# Patient Record
Sex: Male | Born: 1980 | Race: White | Hispanic: No | Smoking: Current every day smoker
Health system: Southern US, Community
[De-identification: ages and names within clinical notes are randomized; demographics above are authoritative.]

## PROBLEM LIST (undated history)

## (undated) ENCOUNTER — Emergency Department (HOSPITAL_COMMUNITY): Admission: EM | Payer: Self-pay | Source: Home / Self Care

## (undated) DIAGNOSIS — J45909 Unspecified asthma, uncomplicated: Secondary | ICD-10-CM

## (undated) DIAGNOSIS — R011 Cardiac murmur, unspecified: Secondary | ICD-10-CM

## (undated) DIAGNOSIS — R51 Headache: Secondary | ICD-10-CM

## (undated) DIAGNOSIS — R519 Headache, unspecified: Secondary | ICD-10-CM

## (undated) DIAGNOSIS — E859 Amyloidosis, unspecified: Secondary | ICD-10-CM

## (undated) DIAGNOSIS — G8929 Other chronic pain: Secondary | ICD-10-CM

## (undated) HISTORY — PX: ADENOIDECTOMY: SUR15

## (undated) HISTORY — PX: TONSILLECTOMY: SUR1361

---

## 1999-12-20 ENCOUNTER — Encounter: Payer: Self-pay | Admitting: Emergency Medicine

## 1999-12-20 ENCOUNTER — Emergency Department (HOSPITAL_COMMUNITY): Admission: EM | Admit: 1999-12-20 | Discharge: 1999-12-20 | Payer: Self-pay | Admitting: Emergency Medicine

## 2006-08-27 ENCOUNTER — Emergency Department (HOSPITAL_COMMUNITY): Admission: EM | Admit: 2006-08-27 | Discharge: 2006-08-27 | Payer: Self-pay | Admitting: Emergency Medicine

## 2007-01-25 ENCOUNTER — Emergency Department (HOSPITAL_COMMUNITY): Admission: EM | Admit: 2007-01-25 | Discharge: 2007-01-26 | Payer: Self-pay | Admitting: Emergency Medicine

## 2008-09-02 ENCOUNTER — Emergency Department (HOSPITAL_COMMUNITY): Admission: EM | Admit: 2008-09-02 | Discharge: 2008-09-02 | Payer: Self-pay | Admitting: Emergency Medicine

## 2011-01-11 LAB — I-STAT 8, (EC8 V) (CONVERTED LAB)
Acid-Base Excess: 3 — ABNORMAL HIGH
BUN: 8
Bicarbonate: 29.6 — ABNORMAL HIGH
Chloride: 106
Hemoglobin: 18.4 — ABNORMAL HIGH
Operator id: 272551
pCO2, Ven: 48.6
pH, Ven: 7.393 — ABNORMAL HIGH

## 2011-01-11 LAB — ETHANOL: Alcohol, Ethyl (B): 204 — ABNORMAL HIGH

## 2011-01-11 LAB — URINALYSIS, ROUTINE W REFLEX MICROSCOPIC
Hgb urine dipstick: NEGATIVE
Ketones, ur: NEGATIVE
Urobilinogen, UA: 0.2

## 2011-01-11 LAB — RAPID URINE DRUG SCREEN, HOSP PERFORMED
Amphetamines: NOT DETECTED
Barbiturates: NOT DETECTED
Cocaine: NOT DETECTED
Opiates: NOT DETECTED
Tetrahydrocannabinol: NOT DETECTED

## 2011-01-11 LAB — POCT I-STAT CREATININE: Creatinine, Ser: 1.3

## 2012-07-12 ENCOUNTER — Emergency Department (HOSPITAL_COMMUNITY)
Admission: EM | Admit: 2012-07-12 | Discharge: 2012-07-13 | Disposition: A | Discharge status: Home / Self Care | Payer: Self-pay | Attending: Emergency Medicine | Admitting: Emergency Medicine

## 2012-07-12 ENCOUNTER — Encounter (HOSPITAL_COMMUNITY): Payer: Self-pay | Admitting: *Deleted

## 2012-07-12 DIAGNOSIS — F17200 Nicotine dependence, unspecified, uncomplicated: Secondary | ICD-10-CM | POA: Insufficient documentation

## 2012-07-12 DIAGNOSIS — R1084 Generalized abdominal pain: Secondary | ICD-10-CM | POA: Insufficient documentation

## 2012-07-12 DIAGNOSIS — R011 Cardiac murmur, unspecified: Secondary | ICD-10-CM | POA: Insufficient documentation

## 2012-07-12 DIAGNOSIS — R112 Nausea with vomiting, unspecified: Secondary | ICD-10-CM | POA: Insufficient documentation

## 2012-07-12 DIAGNOSIS — J45909 Unspecified asthma, uncomplicated: Secondary | ICD-10-CM | POA: Insufficient documentation

## 2012-07-12 DIAGNOSIS — R197 Diarrhea, unspecified: Secondary | ICD-10-CM | POA: Insufficient documentation

## 2012-07-12 DIAGNOSIS — Z87898 Personal history of other specified conditions: Secondary | ICD-10-CM

## 2012-07-12 HISTORY — DX: Cardiac murmur, unspecified: R01.1

## 2012-07-12 HISTORY — DX: Unspecified asthma, uncomplicated: J45.909

## 2012-07-12 NOTE — ED Notes (Addendum)
Pt refusing IV at this time. States "I don't need all of that. Honestly, I just need a work note for today since I didn't go to work this morning.I feel better right now."

## 2012-07-12 NOTE — ED Notes (Signed)
Pt has had N/V/D since yesterday morning.  Pt states vomited "15 times" in past 24 hours and diarrhea "about the same amount of times".  Last occurrence around 1930.  Pt reports having cold sweats.

## 2012-07-13 ENCOUNTER — Encounter (HOSPITAL_COMMUNITY): Payer: Self-pay

## 2012-07-13 MED ORDER — LOPERAMIDE HCL 2 MG PO CAPS: 2.0000 mg | ORAL_CAPSULE | Freq: Four times a day (QID) | ORAL | Status: DC | PRN

## 2012-07-13 MED ORDER — ONDANSETRON HCL 4 MG PO TABS: 4.0000 mg | ORAL_TABLET | Freq: Four times a day (QID) | ORAL | Status: DC
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
## 2012-07-28 ENCOUNTER — Encounter (HOSPITAL_COMMUNITY): Payer: Self-pay | Admitting: Emergency Medicine

## 2012-07-28 ENCOUNTER — Emergency Department (HOSPITAL_COMMUNITY)
Admission: EM | Admit: 2012-07-28 | Discharge: 2012-07-28 | Disposition: A | Discharge status: Home / Self Care | Payer: Self-pay | Attending: Emergency Medicine | Admitting: Emergency Medicine

## 2012-07-28 DIAGNOSIS — W261XXA Contact with sword or dagger, initial encounter: Secondary | ICD-10-CM | POA: Insufficient documentation

## 2012-07-28 DIAGNOSIS — S61411A Laceration without foreign body of right hand, initial encounter: Secondary | ICD-10-CM

## 2012-07-28 DIAGNOSIS — R011 Cardiac murmur, unspecified: Secondary | ICD-10-CM | POA: Insufficient documentation

## 2012-07-28 DIAGNOSIS — J45909 Unspecified asthma, uncomplicated: Secondary | ICD-10-CM | POA: Insufficient documentation

## 2012-07-28 DIAGNOSIS — Y93G1 Activity, food preparation and clean up: Secondary | ICD-10-CM | POA: Insufficient documentation

## 2012-07-28 DIAGNOSIS — F17200 Nicotine dependence, unspecified, uncomplicated: Secondary | ICD-10-CM | POA: Insufficient documentation

## 2012-07-28 DIAGNOSIS — Y92009 Unspecified place in unspecified non-institutional (private) residence as the place of occurrence of the external cause: Secondary | ICD-10-CM | POA: Insufficient documentation

## 2012-07-28 DIAGNOSIS — W260XXA Contact with knife, initial encounter: Secondary | ICD-10-CM | POA: Insufficient documentation

## 2012-07-28 DIAGNOSIS — S61409A Unspecified open wound of unspecified hand, initial encounter: Secondary | ICD-10-CM | POA: Insufficient documentation

## 2012-07-28 NOTE — ED Provider Notes (Signed)
Medical screening examination/treatment/procedure(s) were performed by non-physician practitioner and as supervising physician I was immediately available for consultation/collaboration.  Johndavid Geralds R. Cami Delawder, MD 07/28/12 1521 

## 2012-07-28 NOTE — ED Notes (Signed)
Laceration to rt. Medial part of hand.

## 2012-07-28 NOTE — ED Notes (Signed)
Pt. Stated, I was doing some cooking and I cut myself accidentally.

## 2012-07-28 NOTE — ED Provider Notes (Signed)
History     CSN: [id number]  Arrival date & time 07/28/12  1206   First MD Initiated Contact with Patient 07/28/12 1254      Chief Complaint  Patient presents with  . Laceration    (Consider location/radiation/quality/duration/timing/severity/associated sxs/prior treatment) HPI Patient presents emergency department with a laceration to his right hand.  Patient, states he was cooking some food when he sliced his hand, with a knife.  The laceration is on the  Anatomical medial aspect of the hand.  Patient denies numbness or weakness.  He states he applied direct pressure to stop the bleeding Past Medical History  Diagnosis Date  . Heart murmur   . Asthma     History reviewed. No pertinent past surgical history.  No family history on file.  History  Substance Use Topics  . Smoking status: Current Every Day Smoker -- 0.50 packs/day    Types: Cigarettes  . Smokeless tobacco: Never Used  . Alcohol Use: No      Review of Systems All other systems negative except as documented in the HPI. All pertinent positives and negatives as reviewed in the HPI. Allergies  Review of patient's allergies indicates no known allergies.  Home Medications  No current outpatient prescriptions on file.  BP 133/67  Pulse 90  Temp(Src) 98.9 F (37.2 C) (Oral)  Resp 18  SpO2 98%  Physical Exam  Vitals reviewed. Constitutional: He is oriented to person, place, and time. He appears well-developed and well-nourished. No distress.  Pulmonary/Chest: Effort normal.  Musculoskeletal:       Right hand: Normal sensation noted. Normal strength noted.       Hands: Neurological: He is alert and oriented to person, place, and time.  Skin: Skin is warm and dry.    ED Course  Procedures (including critical care time)  LACERATION REPAIR Performed by: Carlyle Dolly Authorized by: Carlyle Dolly Consent: Verbal consent obtained. Risks and benefits: risks, benefits and alternatives  were discussed Consent given by: patient Patient identity confirmed: provided demographic data Prepped and Draped in normal sterile fashion Wound explored  Laceration Location: Medial aspect of right hand  Laceration Length 3.2cm  No Foreign Bodies seen or palpated  Anesthesia: local infiltration  Local anesthetic: lidocaine 2 % with epinephrine  Anesthetic total: 6 ml  Irrigation method: syringe Amount of cleaning: standard  Skin closure: 5-0 Prolene   Number of sutures: 5   Technique: Simple interrupted   Patient tolerance: Patient tolerated the procedure well with no immediate complications.   Patient is advised to have sutures out in 10 days.  Advised to keep the area clean and dry.  Told to return here for any signs of infection.  Patient has no signs of foreign body inside the wound.  MDM          Carlyle Dolly, PA-C 07/28/12 1408

## 2012-08-16 ENCOUNTER — Encounter (HOSPITAL_COMMUNITY): Payer: Self-pay | Admitting: Emergency Medicine

## 2012-08-16 ENCOUNTER — Emergency Department (HOSPITAL_COMMUNITY)
Admission: EM | Admit: 2012-08-16 | Discharge: 2012-08-16 | Disposition: A | Discharge status: Home / Self Care | Payer: Self-pay | Attending: Emergency Medicine | Admitting: Emergency Medicine

## 2012-08-16 DIAGNOSIS — S61409A Unspecified open wound of unspecified hand, initial encounter: Secondary | ICD-10-CM | POA: Insufficient documentation

## 2012-08-16 DIAGNOSIS — F17200 Nicotine dependence, unspecified, uncomplicated: Secondary | ICD-10-CM | POA: Insufficient documentation

## 2012-08-16 DIAGNOSIS — W268XXA Contact with other sharp object(s), not elsewhere classified, initial encounter: Secondary | ICD-10-CM | POA: Insufficient documentation

## 2012-08-16 DIAGNOSIS — Y9389 Activity, other specified: Secondary | ICD-10-CM | POA: Insufficient documentation

## 2012-08-16 DIAGNOSIS — Y929 Unspecified place or not applicable: Secondary | ICD-10-CM | POA: Insufficient documentation

## 2012-08-16 DIAGNOSIS — IMO0002 Reserved for concepts with insufficient information to code with codable children: Secondary | ICD-10-CM

## 2012-08-16 DIAGNOSIS — R011 Cardiac murmur, unspecified: Secondary | ICD-10-CM | POA: Insufficient documentation

## 2012-08-16 DIAGNOSIS — J45909 Unspecified asthma, uncomplicated: Secondary | ICD-10-CM | POA: Insufficient documentation

## 2012-08-16 DIAGNOSIS — Z23 Encounter for immunization: Secondary | ICD-10-CM | POA: Insufficient documentation
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
## 2012-08-16 NOTE — ED Notes (Signed)
Pt tolerated laceration repair without issue

## 2012-08-16 NOTE — ED Notes (Signed)
PT comfortable with d/c and f/u instructions.  
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
## 2012-08-16 NOTE — ED Provider Notes (Signed)
Medical screening examination/treatment/procedure(s) were performed by non-physician practitioner and as supervising physician I was immediately available for consultation/collaboration.  Philopater Mucha M Sole Lengacher, MD 08/16/12 0302 

## 2013-06-11 ENCOUNTER — Encounter (HOSPITAL_COMMUNITY): Payer: Self-pay | Admitting: Emergency Medicine

## 2013-06-11 ENCOUNTER — Emergency Department (HOSPITAL_COMMUNITY)
Admission: EM | Admit: 2013-06-11 | Discharge: 2013-06-12 | Disposition: A | Discharge status: Home / Self Care | Payer: Self-pay | Attending: Emergency Medicine | Admitting: Emergency Medicine

## 2013-06-11 ENCOUNTER — Emergency Department (HOSPITAL_COMMUNITY): Payer: Self-pay

## 2013-06-11 DIAGNOSIS — R011 Cardiac murmur, unspecified: Secondary | ICD-10-CM | POA: Insufficient documentation

## 2013-06-11 DIAGNOSIS — J45909 Unspecified asthma, uncomplicated: Secondary | ICD-10-CM | POA: Insufficient documentation

## 2013-06-11 DIAGNOSIS — F17200 Nicotine dependence, unspecified, uncomplicated: Secondary | ICD-10-CM | POA: Insufficient documentation

## 2013-06-11 DIAGNOSIS — W3400XA Accidental discharge from unspecified firearms or gun, initial encounter: Secondary | ICD-10-CM | POA: Insufficient documentation

## 2013-06-11 DIAGNOSIS — Y929 Unspecified place or not applicable: Secondary | ICD-10-CM | POA: Insufficient documentation

## 2013-06-11 DIAGNOSIS — Y9389 Activity, other specified: Secondary | ICD-10-CM | POA: Insufficient documentation

## 2013-06-11 DIAGNOSIS — S31809A Unspecified open wound of unspecified buttock, initial encounter: Secondary | ICD-10-CM | POA: Insufficient documentation

## 2013-06-11 NOTE — ED Notes (Signed)
Pt denies fever, N/V.

## 2013-06-11 NOTE — ED Notes (Signed)
Nurse First Rounds : Nurse explained delay . Wait time and process to pt. Respirations unlabored / VSS / no distress.
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
## 2013-06-12 MED ORDER — SULFAMETHOXAZOLE-TRIMETHOPRIM 800-160 MG PO TABS: 1.0000 | ORAL_TABLET | Freq: Two times a day (BID) | ORAL | Status: DC

## 2013-06-12 NOTE — ED Provider Notes (Signed)
Medical screening examination/treatment/procedure(s) were performed by non-physician practitioner and as supervising physician I was immediately available for consultation/collaboration.   EKG Interpretation None        Sailor Haughn M Airika Alkhatib, MD 06/12/13 0503 

## 2013-06-12 NOTE — ED Notes (Signed)
Pt is ambulatory. Pt is being discharged with family member.
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
## 2013-07-02 ENCOUNTER — Telehealth (HOSPITAL_COMMUNITY): Payer: Self-pay

## 2013-07-02 NOTE — Telephone Encounter (Signed)
We have never seen the patient and therefore I cannot give him any medical advice.  I called him and left a message to refer back to ED when he was seen on the 11th.  Carnelia Oscar, ANP-BC

## 2013-09-23 ENCOUNTER — Encounter (HOSPITAL_COMMUNITY): Payer: Self-pay | Admitting: Emergency Medicine

## 2013-09-23 ENCOUNTER — Emergency Department (HOSPITAL_COMMUNITY)
Admission: EM | Admit: 2013-09-23 | Discharge: 2013-09-23 | Disposition: A | Discharge status: Home / Self Care | Payer: Self-pay | Attending: Emergency Medicine | Admitting: Emergency Medicine

## 2013-09-23 ENCOUNTER — Emergency Department (HOSPITAL_COMMUNITY): Payer: Self-pay

## 2013-09-23 DIAGNOSIS — F17200 Nicotine dependence, unspecified, uncomplicated: Secondary | ICD-10-CM | POA: Insufficient documentation

## 2013-09-23 DIAGNOSIS — R011 Cardiac murmur, unspecified: Secondary | ICD-10-CM | POA: Insufficient documentation

## 2013-09-23 DIAGNOSIS — M79601 Pain in right arm: Secondary | ICD-10-CM

## 2013-09-23 DIAGNOSIS — J45909 Unspecified asthma, uncomplicated: Secondary | ICD-10-CM | POA: Insufficient documentation

## 2013-09-23 DIAGNOSIS — M79609 Pain in unspecified limb: Secondary | ICD-10-CM | POA: Insufficient documentation

## 2013-09-23 LAB — CK: CK TOTAL: 5592 U/L — AB (ref 7–232)

## 2013-09-23 LAB — BASIC METABOLIC PANEL
BUN: 8 mg/dL (ref 6–23)
CHLORIDE: 104 meq/L (ref 96–112)
CO2: 24 meq/L (ref 19–32)
Calcium: 9.7 mg/dL (ref 8.4–10.5)
Creatinine, Ser: 1.15 mg/dL (ref 0.50–1.35)
GFR calc Af Amer: >90 mL/min (ref >90)
GFR calc non Af Amer: 82 mL/min — ABNORMAL LOW (ref >90)
GLUCOSE: 94 mg/dL (ref 70–99)
POTASSIUM: 4.5 meq/L (ref 3.7–5.3)
SODIUM: 140 meq/L (ref 137–147)

## 2013-09-23 LAB — URINE MICROSCOPIC-ADD ON

## 2013-09-23 LAB — URINALYSIS, ROUTINE W REFLEX MICROSCOPIC
Bilirubin Urine: NEGATIVE
GLUCOSE, UA: NEGATIVE mg/dL
Hgb urine dipstick: NEGATIVE
Ketones, ur: 15 mg/dL — AB
Leukocytes, UA: NEGATIVE
Nitrite: NEGATIVE
PH: 6 (ref 5.0–8.0)
PROTEIN: 30 mg/dL — AB
SPECIFIC GRAVITY, URINE: 1.023 (ref 1.005–1.030)
Urobilinogen, UA: 1 mg/dL (ref 0.0–1.0)

## 2013-09-23 LAB — I-STAT TROPONIN, ED: Troponin i, poc: 0 ng/mL (ref 0.00–0.08)

## 2013-09-23 LAB — CBC
HCT: 43.3 % (ref 39.0–52.0)
Hemoglobin: 14.7 g/dL (ref 13.0–17.0)
MCH: 29.6 pg (ref 26.0–34.0)
MCHC: 33.9 g/dL (ref 30.0–36.0)
MCV: 87.3 fL (ref 78.0–100.0)
PLATELETS: 153 10*3/uL (ref 150–400)
RBC: 4.96 MIL/uL (ref 4.22–5.81)
RDW: 13.4 % (ref 11.5–15.5)
WBC: 6.2 10*3/uL (ref 4.0–10.5)

## 2013-09-23 LAB — I-STAT CG4 LACTIC ACID, ED: LACTIC ACID, VENOUS: 0.63 mmol/L (ref 0.5–2.2)

## 2013-09-23 MED ORDER — MORPHINE SULFATE 4 MG/ML IJ SOLN
4.0000 mg | Freq: Once | INTRAMUSCULAR | Status: AC
  Administered 2013-09-23: 4 mg via INTRAVENOUS

## 2013-09-23 MED ORDER — SODIUM CHLORIDE 0.9 % IV BOLUS (SEPSIS)
1000.0000 mL | Freq: Once | INTRAVENOUS | Status: AC
  Administered 2013-09-23: 1000 mL via INTRAVENOUS

## 2013-09-23 MED ORDER — MORPHINE SULFATE 4 MG/ML IJ SOLN
4.0000 mg | Freq: Once | INTRAMUSCULAR | Status: DC
  Filled 2013-09-23: qty 1

## 2013-09-23 MED ORDER — HYDROCODONE-ACETAMINOPHEN 5-325 MG PO TABS: 1.0000 | ORAL_TABLET | ORAL | Status: DC | PRN

## 2013-09-23 NOTE — ED Notes (Signed)
Pt brought to room; pt undressed, in gown, on monitor, continuous pulse oximetry and blood pressure cuff; Toniann FailWendy, RN aware

## 2013-09-23 NOTE — ED Notes (Signed)
Pt states hes had tingling and pain in R arm for past few days. He states when he tried to pick up something the symptoms increase. He denies chest pain now, but states hes had some intermittent cp for "years." he is alert and breathing easily

## 2013-09-23 NOTE — ED Provider Notes (Signed)
Medical screening examination/treatment/procedure(s) were conducted as a shared visit with non-physician practitioner(s) and myself.  I personally evaluated the patient during the encounter.   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  He states his been working hard with this.  This is likely musculoskeletal arm pain.  He has mild elevation of his CKs with normal renal function.  Patient was hydrated in the emergency department.  He is urinating without difficulty.  Patient instructed to continue oral hydration.  Home with a short course of pain medicine.  No signs of infection.  Normal right radial pulse.  Overall well-appearing.  This is not a ncardiac presentation.   Lyanne CoKevin M Campos, MD 09/23/13 [phone]

## 2013-09-23 NOTE — Progress Notes (Signed)
Right upper extremity venous duplex:  No evidence of DVT or superficial thrombosis.    

## 2013-09-23 NOTE — ED Provider Notes (Signed)
CSN: [id number]     Arrival date & time 09/23/13  1222 History   First MD Initiated Contact with Patient 09/23/13 1234     Chief Complaint  Patient presents with  . Arm Pain     (Consider location/radiation/quality/duration/timing/severity/associated sxs/prior Treatment) HPI Comments: Todd Randolph is a 33 y.o. Male with a PMHx of asthma who presents to the ED today complaining of 4/10 sharp R upper arm pain and intermittent numbness, that began 3 days ago. Pain is located in right bicep and radiates down to right hand. Has not tried anything to relieve this pain. Aggravated by lifting heavy objects and moving his arm. States the pain comes and goes. Endorses slightly darker urine recently. Denies CP currently, but states he's had CP intermittently for years. Denies fevers/chills, HA, neck pain, vision changes, DOE, SOB, chest tightness or pressure, radiation to left arm, jaw or back, abd pain, N/V/D, myalgias in other extremities, weakness, paresthesias, diaphoresis, or hematuria/dysuria. Denies leg swelling or pain. Denies erythema or swelling to his R arm. No recent travel, no recent injury or trauma, denies strenuous activity aside from his job. Works at a factory lifting heavy objects, but denies lifting anything when this arm starting hurting. Smokes daily, denies alcohol or IV drug use. No hx of DVT.   Patient is a 33 y.o. male presenting with arm pain. The history is provided by the patient. No language interpreter was used.  Arm Pain This is a new problem. The current episode started in the past 7 days. The problem occurs intermittently. The problem has been waxing and waning. Associated symptoms include myalgias (right bicep) and numbness (intermittent, into right hand). Pertinent negatives include no abdominal pain, anorexia, arthralgias, chest pain (currently), chills, congestion, coughing, diaphoresis, fever, headaches, joint swelling, nausea, neck pain, rash, sore throat, urinary  symptoms, vertigo, visual change, vomiting or weakness. Exacerbated by: movement of right arm. He has tried nothing for the symptoms.    Past Medical History  Diagnosis Date  . Heart murmur   . Asthma    Past Surgical History  Procedure Laterality Date  . Adenoidectomy    . Tonsillectomy     History reviewed. No pertinent family history. History  Substance Use Topics  . Smoking status: Current Every Day Smoker -- 0.50 packs/day    Types: Cigarettes  . Smokeless tobacco: Never Used  . Alcohol Use: No    Review of Systems  Constitutional: Negative for fever, chills and diaphoresis.  HENT: Negative for congestion, rhinorrhea, sinus pressure and sore throat.   Respiratory: Negative for cough, choking, chest tightness, shortness of breath and wheezing.   Cardiovascular: Negative for chest pain (currently), palpitations and leg swelling.  Gastrointestinal: Negative for nausea, vomiting, abdominal pain, diarrhea, constipation, abdominal distention and anorexia.  Genitourinary: Negative for dysuria and hematuria.  Musculoskeletal: Positive for myalgias (right bicep). Negative for arthralgias, back pain, joint swelling, neck pain and neck stiffness.  Skin: Negative for color change and rash.  Neurological: Positive for numbness (intermittent, into right hand). Negative for dizziness, vertigo, syncope, weakness, light-headedness and headaches.  Psychiatric/Behavioral: Negative for confusion.  10 Systems reviewed and are negative for acute change except as noted in the HPI.     Allergies  Review of patient's allergies indicates no known allergies.  Home Medications   Prior to Admission medications   Not on File   BP 124/92  Pulse 67  Temp(Src) 98.8 F (37.1 C) (Oral)  Resp 24  Ht 6' (1.829 m)  Wt 171  lb (77.565 kg)  BMI 23.19 kg/m2  SpO2 100% Physical Exam  Nursing note and vitals reviewed. Constitutional: He is oriented to person, place, and time. Vital signs are  normal. He appears well-developed and well-nourished. No distress.  HENT:  Head: Normocephalic and atraumatic.  Mouth/Throat: Oropharynx is clear and moist and mucous membranes are normal.  Eyes: Conjunctivae and EOM are normal. Right eye exhibits no discharge. Left eye exhibits no discharge.  Neck: Normal range of motion. Neck supple. No spinous process tenderness and no muscular tenderness present. No rigidity. No edema present.  Cardiovascular: Normal rate, regular rhythm, normal heart sounds and intact distal pulses.   No murmur heard. Distal pulses intact in all extremities. No pedal edema noted  Pulmonary/Chest: Effort normal and breath sounds normal. He has no decreased breath sounds. He has no wheezes. He has no rales. He exhibits no tenderness and no crepitus.  Abdominal: Soft. Normal appearance and bowel sounds are normal. He exhibits no distension. There is no tenderness. There is no rigidity, no rebound, no guarding and no CVA tenderness.  Musculoskeletal: Normal range of motion.  Right shoulder non-TTP, no swelling or deformity, no crepitus. R arm ROM limited due to pain in biceps muscle. Right bicipital tendon non-tender, but muscle itself is TTP. No swelling or erythema noted in RUE. R elbow and wrist ROM intact, non-TTP. Muscle strength 5/5 in b/l UE. Sensation intact.   Neurological: He is alert and oriented to person, place, and time. He has normal strength. No sensory deficit.  Axillary, medial, radial, and ulnar nerve sensation and muscle strength intact in B/L UEs. Strength 5/5 bilaterally.   Skin: Skin is warm, dry and intact. No rash noted. No erythema.  No erythema or swelling noted in RUE.  Psychiatric: He has a normal mood and affect.    ED Course  Procedures (including critical care time) Labs Review Labs Reviewed  BASIC METABOLIC PANEL - Abnormal; Notable for the following:    GFR calc non Af Amer 82 (*)    All other components within normal limits  URINALYSIS,  ROUTINE W REFLEX MICROSCOPIC - Abnormal; Notable for the following:    Ketones, ur 15 (*)    Protein, ur 30 (*)    All other components within normal limits  CK - Abnormal; Notable for the following:    Total CK 5592 (*)    All other components within normal limits  URINE MICROSCOPIC-ADD ON - Abnormal; Notable for the following:    Squamous Epithelial / LPF FEW (*)    Bacteria, UA FEW (*)    All other components within normal limits  CBC  I-STAT TROPOININ, ED  I-STAT CG4 LACTIC ACID, ED    Imaging Review Dg Chest 2 View  09/23/2013   CLINICAL DATA:  33 year old male with pain and numbness radiating to the right arm with shortness of Breath. Initial encounter.  EXAM: CHEST  2 VIEW  COMPARISON:  01/25/2007.  FINDINGS: Lung volumes are stable and normal. Normal cardiac size and mediastinal contours. Visualized tracheal air column is within normal limits. Lung parenchyma is stable and clear. No pneumothorax or effusion. No acute osseous abnormality identified.  IMPRESSION: Negative, no acute cardiopulmonary abnormality.   Electronically Signed   By: Augusto Gamble M.D.   On: 09/23/2013 13:00   Upper extremity Venous Duplex U/S:  Smiley Houseman, RVT (Cardiovascular Sonographer)      Right upper extremity venous duplex: No evidence of DVT or superficial thrombosis.  EKG Interpretation   Date/Time:  Tuesday September 23 2013 12:29:42 EDT Ventricular Rate:  97 PR Interval:  142 QRS Duration: 84 QT Interval:  346 QTC Calculation: 439 R Axis:   85 Text Interpretation:  Normal sinus rhythm with sinus arrhythmia Normal ECG  No old tracing to compare Confirmed by [facility] HospitalINKER  MD, MARTHA [phone]([id number]) on  09/23/2013 3:28:17 PM      MDM   Final diagnoses:  None    Todd Randolph is a 33 y.o. male with a PMHx of asthma who presents with R arm/biceps pain with "tingling" x 3 days. Denies CP at this time, but has had intermittent CP "for years". Low clinical suspicion for ACS but will obtain labs and  cardiac rule out. DDx includes UE clot, although unlikely given lack of swelling and erythema; muscle strain, given his job and activities that worsen pain; rhabdomyolysis, or muscle spasm. Will give fluids and morphine, obtain CK and U/A, UE U/S, and reassess.  2:34 PM CXR negative. Troponin negative. Low clinical suspicion at this time for ACS. U/A demonstrates ketonuria and proteinuria with no signs of infection. CK 5592, therefore likely rhabdomyolysis or muscle injury; this would also account for the U/A results. BMP WNL, no electrolyte imbalances, no kidney dysfunction although Cr is nearing the upper limit of normal. Will give 2nd fluid bolus when pt returns from U/S and reassess.  3:15 PM Pt back from U/S, which is reported as negative for DVT per Alesia BandaSandra Biggs, RVT. Pain controlled better with Morphine, although still reporting some pain with movement. 2nd back of fluids hanging now. Will recheck CK after fluids, in addition to lactic acid, uric acid, and Phos.   4:23 PM Fluids infusing, almost complete. Will obtain f/up labs now and reassess degree of elevation in CK. Pain well controlled at the moment.  4:49 PM Labs pending, 3rd fluid bolus hanging now. Checked out to Azalia BilisKevin Campos MD who will follow up with labs and dispo pt. Please see his note for further documentation.  Donnita FallsMercedes Strupp East Hem[place]ub[place], [place] JerseyPA-C 09/23/13 1651

## 2013-09-26 ENCOUNTER — Encounter (HOSPITAL_COMMUNITY): Payer: Self-pay | Admitting: Emergency Medicine

## 2013-09-26 DIAGNOSIS — F17200 Nicotine dependence, unspecified, uncomplicated: Secondary | ICD-10-CM | POA: Insufficient documentation

## 2013-09-26 DIAGNOSIS — J45909 Unspecified asthma, uncomplicated: Secondary | ICD-10-CM | POA: Insufficient documentation

## 2013-09-26 DIAGNOSIS — K5900 Constipation, unspecified: Secondary | ICD-10-CM | POA: Insufficient documentation

## 2013-09-26 DIAGNOSIS — R011 Cardiac murmur, unspecified: Secondary | ICD-10-CM | POA: Insufficient documentation

## 2013-09-26 LAB — CBC WITH DIFFERENTIAL/PLATELET
BASOS ABS: 0 10*3/uL (ref 0.0–0.1)
Basophils Relative: 0 % (ref 0–1)
Eosinophils Absolute: 0.1 10*3/uL (ref 0.0–0.7)
Eosinophils Relative: 2 % (ref 0–5)
HCT: 40.8 % (ref 39.0–52.0)
Hemoglobin: 13.4 g/dL (ref 13.0–17.0)
LYMPHS PCT: 60 % — AB (ref 12–46)
Lymphs Abs: 2.6 10*3/uL (ref 0.7–4.0)
MCH: 29.5 pg (ref 26.0–34.0)
MCHC: 32.8 g/dL (ref 30.0–36.0)
MCV: 89.7 fL (ref 78.0–100.0)
Monocytes Absolute: 0.3 10*3/uL (ref 0.1–1.0)
Monocytes Relative: 6 % (ref 3–12)
NEUTROS ABS: 1.4 10*3/uL — AB (ref 1.7–7.7)
Neutrophils Relative %: 32 % — ABNORMAL LOW (ref 43–77)
PLATELETS: 151 10*3/uL (ref 150–400)
RBC: 4.55 MIL/uL (ref 4.22–5.81)
RDW: 13.5 % (ref 11.5–15.5)
WBC: 4.4 10*3/uL (ref 4.0–10.5)

## 2013-09-26 NOTE — ED Notes (Signed)
Presents with constipation for 4 days and vomiting. Endorses distended abdomen, abdomen is soft and non tender to palpation.

## 2013-09-26 NOTE — Discharge Planning (Signed)
[facility]ia[facility] was not able to see patient, [facility] orange card information will be mailed to the address listed in EPIC.

## 2013-09-27 ENCOUNTER — Emergency Department (HOSPITAL_COMMUNITY): Payer: Self-pay | Attending: Emergency Medicine

## 2013-09-27 ENCOUNTER — Emergency Department (HOSPITAL_COMMUNITY)
Admission: EM | Admit: 2013-09-27 | Discharge: 2013-09-27 | Disposition: A | Discharge status: Home / Self Care | Payer: Self-pay | Attending: Emergency Medicine | Admitting: Emergency Medicine

## 2013-09-27 DIAGNOSIS — K5900 Constipation, unspecified: Secondary | ICD-10-CM

## 2013-09-27 LAB — BASIC METABOLIC PANEL
BUN: 9 mg/dL (ref 6–23)
CHLORIDE: 103 meq/L (ref 96–112)
CO2: 28 meq/L (ref 19–32)
Calcium: 8.9 mg/dL (ref 8.4–10.5)
Creatinine, Ser: 1.08 mg/dL (ref 0.50–1.35)
GFR calc Af Amer: >90 mL/min (ref >90)
GFR calc non Af Amer: 88 mL/min — ABNORMAL LOW (ref >90)
Glucose, Bld: 88 mg/dL (ref 70–99)
POTASSIUM: 3.6 meq/L — AB (ref 3.7–5.3)
SODIUM: 142 meq/L (ref 137–147)

## 2013-09-27 NOTE — ED Provider Notes (Signed)
CSN: [id number]     Arrival date & time 09/26/13  2301 History   First MD Initiated Contact with Patient 09/27/13 0026     Chief Complaint  Patient presents with  . Constipation  . Emesis     (Consider location/radiation/quality/duration/timing/severity/associated sxs/prior Treatment) HPI Comments: 33 year old male with no significant medical or surgical history, smoker presents with no bowel movement since Tuesday. Patient has had one episode of vomiting daily nonbloody nonbilious. No known gallbladder problems no specific foods worsen. No current medications. No history of bowel obstruction or any significant medical history. Patient overall feels okay and is tolerating by mouth except for the one episode of vomiting a day. No known GERD history. Patient feels that started since being in ER for dehydration receiving morphine dose. No other narcotic or IV drug use history.  Patient is a 33 y.o. male presenting with constipation and vomiting. The history is provided by the patient.  Constipation Associated symptoms: abdominal pain (mild intermittent cramps no pain currently), nausea and vomiting   Associated symptoms: no back pain, no dysuria and no fever   Emesis Associated symptoms: abdominal pain (mild intermittent cramps no pain currently)   Associated symptoms: no chills and no headaches     Past Medical History  Diagnosis Date  . Heart murmur   . Asthma    Past Surgical History  Procedure Laterality Date  . Adenoidectomy    . Tonsillectomy     History reviewed. No pertinent family history. History  Substance Use Topics  . Smoking status: Current Every Day Smoker -- 0.50 packs/day    Types: Cigarettes  . Smokeless tobacco: Never Used  . Alcohol Use: No    Review of Systems  Constitutional: Negative for fever and chills.  HENT: Negative for congestion.   Eyes: Negative for visual disturbance.  Respiratory: Negative for shortness of breath.   Cardiovascular: Negative  for chest pain.  Gastrointestinal: Positive for nausea, vomiting, abdominal pain (mild intermittent cramps no pain currently) and constipation.  Genitourinary: Negative for dysuria and flank pain.  Musculoskeletal: Negative for back pain, neck pain and neck stiffness.  Skin: Negative for rash.  Neurological: Negative for light-headedness and headaches.      Allergies  Review of patient's allergies indicates no known allergies.  Home Medications   Prior to Admission medications   Medication Sig Start Date End Date Taking? Authorizing Provider  HYDROcodone-acetaminophen (NORCO/VICODIN) 5-325 MG per tablet Take 1 tablet by mouth every 4 (four) hours as needed for moderate pain. 09/23/13  Yes Lyanne CoKevin M Campos, MD  ibuprofen (ADVIL,MOTRIN) 200 MG tablet Take 800 mg by mouth every 6 (six) hours as needed for mild pain.   Yes Historical Provider, MD   BP 120/71  Pulse 53  Temp(Src) 98.1 F (36.7 C)  Resp 16  Ht 6' (1.829 m)  Wt 172 lb (78.019 kg)  BMI 23.32 kg/m2  SpO2 98% Physical Exam  Nursing note and vitals reviewed. Constitutional: He is oriented to person, place, and time. He appears well-developed and well-nourished.  HENT:  Head: Normocephalic and atraumatic.  Eyes: Conjunctivae are normal. Right eye exhibits no discharge. Left eye exhibits no discharge.  Neck: Normal range of motion. Neck supple. No tracheal deviation present.  Cardiovascular: Normal rate, regular rhythm and intact distal pulses.   Pulmonary/Chest: Effort normal and breath sounds normal.  Abdominal: Soft. He exhibits no distension. There is no tenderness. There is no guarding.  Musculoskeletal: He exhibits no edema.  Neurological: He is alert and oriented  to person, place, and time.  Skin: Skin is warm. No rash noted.  Psychiatric: He has a normal mood and affect.    ED Course  Procedures (including critical care time) Labs Review Labs Reviewed  CBC WITH DIFFERENTIAL - Abnormal; Notable for the  following:    Neutrophils Relative % 32 (*)    Neutro Abs 1.4 (*)    Lymphocytes Relative 60 (*)    All other components within normal limits  BASIC METABOLIC PANEL - Abnormal; Notable for the following:    Potassium 3.6 (*)    GFR calc non Af Amer 88 (*)    All other components within normal limits    Imaging Review Dg Abd 1 View  09/27/2013   CLINICAL DATA:  Constipation and emesis  EXAM: ABDOMEN - 1 VIEW  COMPARISON:  None.  FINDINGS: The bowel gas pattern is normal. No radio-opaque calculi or other significant radiographic abnormality are seen. Remote fracture of the proximal left femur, accounting for a small ossific fragment superior to the greater trochanter.  IMPRESSION: Negative.   Electronically Signed   By: Tiburcio PeaJonathan  Watts M.D.   On: 09/27/2013 00:32     EKG Interpretation None      MDM   Final diagnoses:  Constipation, unspecified constipation type   Well-appearing male with benign abdominal exam and constipation for 3 days. Discussed trying fruits and increase exercise and discussed reasons to return. Patient will try MiraLAX and prefers to try at home if change in diet exercise do not work.  If you were given medicines take as directed.  If you are on coumadin or contraceptives realize their levels and effectiveness is altered by many different medicines.  If you have any reaction (rash, tongues swelling, other) to the medicines stop taking and see a physician.   Please follow up as directed and return to the ER or see a physician for new or worsening symptoms.  Thank you. Filed Vitals:   09/27/13 0019 09/27/13 0030 09/27/13 0100 09/27/13 0145  BP: 120/71 117/66 117/68 115/63  Pulse: 53 59 55 57  Temp:      Resp:      Height:      Weight:      SpO2: 98% 97% 94% 95%     Enid SkeensJoshua M Zavitz, MD 09/27/13 [phone]

## 2013-09-27 NOTE — Discharge Instructions (Signed)
Try prunes and fruit with increased exercise, if no improvement try miralax from pharmacy.  If you were given medicines take as directed.  If you are on coumadin or contraceptives realize their levels and effectiveness is altered by many different medicines.  If you have any reaction (rash, tongues swelling, other) to the medicines stop taking and see a physician.   Please follow up as directed and return to the ER or see a physician for new or worsening symptoms.  Thank you. Filed Vitals:   09/26/13 2320 09/27/13 0019  BP: 116/71 120/71  Pulse: 55 53  Temp: 98.1 F (36.7 C)   Resp: 16   Height: 6' (1.829 m)   Weight: 172 lb (78.019 kg)   SpO2: 100% 98%    Constipation Constipation is when a person:  Poops (has a bowel movement) less than 3 times a week.  Has a hard time pooping.  Has poop that is dry, hard, or bigger than normal. HOME CARE   Eat foods with a lot of fiber in them. This includes fruits, vegetables, beans, and whole grains such as brown rice.  Avoid fatty foods and foods with a lot of sugar. This includes french fries, hamburgers, cookies, candy, and soda.  If you are not getting enough fiber from food, take products with added fiber in them (supplements).  Drink enough fluid to keep your pee (urine) clear or pale yellow.  Exercise on a regular basis, or as told by your doctor.  Go to the restroom when you feel like you need to poop. Do not hold it.  Only take medicine as told by your doctor. Do not take medicines that help you poop (laxatives) without talking to your doctor first. GET HELP RIGHT AWAY IF:   You have bright red blood in your poop (stool).  Your constipation lasts more than 4 days or gets worse.  You have belly (abdominal) or butt (rectal) pain.  You have thin poop (as thin as a pencil).  You lose weight, and it cannot be explained. MAKE SURE YOU:   Understand these instructions.  Will watch your condition.  Will get help right  away if you are not doing well or get worse. Document Released: 09/06/2007 Document Revised: 03/25/2013 Document Reviewed: 12/30/2012 [facility] Patient Information 2015 [employer] [place]. This information is not intended to replace advice given to you by your health care provider. Make sure you discuss any questions you have with your health care provider.

## 2013-09-27 NOTE — ED Notes (Signed)
Pt A&Ox4, ambulatory at d/c with steady gait, NAD/ Declined wheelchair. NAD.

## 2013-09-27 NOTE — ED Notes (Signed)
Pt states he was given morphine here on Tuesday, and since then he has been constipated. No distention or tenderness present upon palpation.

## 2013-09-27 NOTE — ED Notes (Signed)
Dr Zavitz at bedside  

## 2014-07-14 ENCOUNTER — Emergency Department (HOSPITAL_COMMUNITY)
Admission: EM | Admit: 2014-07-14 | Discharge: 2014-07-14 | Disposition: A | Discharge status: Home / Self Care | Payer: Self-pay | Attending: Emergency Medicine | Admitting: Emergency Medicine

## 2014-07-14 ENCOUNTER — Encounter (HOSPITAL_COMMUNITY): Payer: Self-pay | Admitting: Emergency Medicine

## 2014-07-14 ENCOUNTER — Emergency Department (HOSPITAL_COMMUNITY): Admission: EM | Admit: 2014-07-14 | Discharge: 2014-07-14 | Discharge status: Absent without leave | Payer: Self-pay

## 2014-07-14 DIAGNOSIS — R011 Cardiac murmur, unspecified: Secondary | ICD-10-CM | POA: Insufficient documentation

## 2014-07-14 DIAGNOSIS — Z720 Tobacco use: Secondary | ICD-10-CM | POA: Insufficient documentation

## 2014-07-14 DIAGNOSIS — J45909 Unspecified asthma, uncomplicated: Secondary | ICD-10-CM | POA: Insufficient documentation

## 2014-07-14 DIAGNOSIS — R112 Nausea with vomiting, unspecified: Secondary | ICD-10-CM | POA: Insufficient documentation

## 2014-07-14 LAB — COMPREHENSIVE METABOLIC PANEL
ALT: 17 U/L (ref 0–53)
AST: 29 U/L (ref 0–37)
Albumin: 4.2 g/dL (ref 3.5–5.2)
Alkaline Phosphatase: 36 U/L — ABNORMAL LOW (ref 39–117)
Anion gap: 9 (ref 5–15)
BUN: 9 mg/dL (ref 6–23)
CHLORIDE: 103 mmol/L (ref 96–112)
CO2: 27 mmol/L (ref 19–32)
Calcium: 10.1 mg/dL (ref 8.4–10.5)
Creatinine, Ser: 1.16 mg/dL (ref 0.50–1.35)
GFR calc Af Amer: >90 mL/min (ref >90)
GFR calc non Af Amer: 80 mL/min — ABNORMAL LOW (ref >90)
Glucose, Bld: 79 mg/dL (ref 70–99)
Potassium: 4.8 mmol/L (ref 3.5–5.1)
Sodium: 139 mmol/L (ref 135–145)
TOTAL PROTEIN: 7.2 g/dL (ref 6.0–8.3)
Total Bilirubin: 0.8 mg/dL (ref 0.3–1.2)

## 2014-07-14 LAB — CBC WITH DIFFERENTIAL/PLATELET
BASOS ABS: 0 10*3/uL (ref 0.0–0.1)
Basophils Relative: 0 % (ref 0–1)
EOS PCT: 1 % (ref 0–5)
Eosinophils Absolute: 0 10*3/uL (ref 0.0–0.7)
HCT: 45.5 % (ref 39.0–52.0)
HEMOGLOBIN: 15.1 g/dL (ref 13.0–17.0)
LYMPHS PCT: 50 % — AB (ref 12–46)
Lymphs Abs: 2.5 10*3/uL (ref 0.7–4.0)
MCH: 29.6 pg (ref 26.0–34.0)
MCHC: 33.2 g/dL (ref 30.0–36.0)
MCV: 89.2 fL (ref 78.0–100.0)
MONO ABS: 0.2 10*3/uL (ref 0.1–1.0)
MONOS PCT: 4 % (ref 3–12)
Neutro Abs: 2.2 10*3/uL (ref 1.7–7.7)
Neutrophils Relative %: 45 % (ref 43–77)
Platelets: 171 10*3/uL (ref 150–400)
RBC: 5.1 MIL/uL (ref 4.22–5.81)
RDW: 13.3 % (ref 11.5–15.5)
WBC: 4.9 10*3/uL (ref 4.0–10.5)

## 2014-07-14 LAB — LIPASE, BLOOD: LIPASE: 28 U/L (ref 11–59)

## 2014-07-14 MED ORDER — SUCRALFATE 1 G PO TABS: 1.0000 g | ORAL_TABLET | Freq: Three times a day (TID) | ORAL | Status: DC

## 2014-07-14 MED ORDER — ONDANSETRON 4 MG PO TBDP: 4.0000 mg | ORAL_TABLET | Freq: Three times a day (TID) | ORAL | Status: DC | PRN

## 2014-07-14 NOTE — ED Provider Notes (Signed)
CSN: [id number]     Arrival date & time 07/14/14  1012 History  This chart was scribed for non-physician practitioner, Sharilyn Sites, PA-C working with Azalia Bilis, MD by Greggory Stallion, ED scribe. This patient was seen in room TR01C/TR01C and the patient's care was started at 1:26 PM.   Chief Complaint  Patient presents with  . Emesis   The history is provided by the patient. No language interpreter was used.    HPI Comments: Milus Fritze is a 34 y.o. male who presents to the Emergency Department complaining of intermittent emesis over the last 2 months. Episodes are sporadic every week. Pt came in today because it has become more frequent within the last week. Emesis is random, not just after eating or drinking. He reports abdominal pain immediately before and after an episode and denies it currently. His diet has mainly been fatty/greasy foods and soda. Pt denies fever, nausea, diarrhea. He denies history of digestion problems and has never seen a GI doctor.  No prior abdominal surgeries.  Past Medical History  Diagnosis Date  . Heart murmur   . Asthma    Past Surgical History  Procedure Laterality Date  . Adenoidectomy    . Tonsillectomy     History reviewed. No pertinent family history. History  Substance Use Topics  . Smoking status: Current Every Day Smoker -- 0.50 packs/day    Types: Cigarettes  . Smokeless tobacco: Never Used  . Alcohol Use: No    Review of Systems  Constitutional: Negative for fever.  Gastrointestinal: Positive for vomiting. Negative for nausea and abdominal pain.  All other systems reviewed and are negative.  Allergies  Review of patient's allergies indicates no known allergies.  Home Medications   Prior to Admission medications   Medication Sig Start Date End Date Taking? Authorizing Provider  HYDROcodone-acetaminophen (NORCO/VICODIN) 5-325 MG per tablet Take 1 tablet by mouth every 4 (four) hours as needed for moderate pain. 09/23/13   Azalia Bilis, MD  ibuprofen (ADVIL,MOTRIN) 200 MG tablet Take 800 mg by mouth every 6 (six) hours as needed for mild pain.    Historical Provider, MD   BP 130/82 mmHg  Pulse 99  Temp(Src) 98 F (36.7 C) (Oral)  Resp 18  SpO2 100%   Physical Exam  Constitutional: He is oriented to person, place, and time. He appears well-developed and well-nourished. No distress.  HENT:  Head: Normocephalic and atraumatic.  Mouth/Throat: Oropharynx is clear and moist.  Eyes: Conjunctivae and EOM are normal. Pupils are equal, round, and reactive to light.  Neck: Normal range of motion. Neck supple.  Cardiovascular: Normal rate, regular rhythm and normal heart sounds.   Pulmonary/Chest: Effort normal and breath sounds normal. No respiratory distress. He has no wheezes.  Abdominal: Soft. Bowel sounds are normal. There is no tenderness. There is no guarding.  Musculoskeletal: Normal range of motion. He exhibits no edema.  Neurological: He is alert and oriented to person, place, and time.  Skin: Skin is warm and dry. He is not diaphoretic.  Psychiatric: He has a normal mood and affect.  Nursing note and vitals reviewed.   ED Course  Procedures (including critical care time)  DIAGNOSTIC STUDIES: Oxygen Saturation is 100% on RA, normal by my interpretation.    COORDINATION OF CARE: 1:31 PM-Discussed treatment plan which includes diet change and GI medications with pt at bedside and pt agreed to plan. Will give pt GI referral and advised him to follow up if symptoms do not start  resolving.   Labs Review Labs Reviewed  COMPREHENSIVE METABOLIC PANEL - Abnormal; Notable for the following:    Alkaline Phosphatase 36 (*)    GFR calc non Af Amer 80 (*)    All other components within normal limits  CBC WITH DIFFERENTIAL/PLATELET - Abnormal; Notable for the following:    Lymphocytes Relative 50 (*)    All other components within normal limits  LIPASE, BLOOD    Imaging Review No results found.   EKG  Interpretation None      MDM   Final diagnoses:  Nausea and vomiting, vomiting of unspecified type   34 year old male with remittent vomiting for the past 2 months. No definitive food associations, but he does have diet high in soda, fried food, and complex substances. On exam patient is afebrile and nontoxic in appearance. His abdominal exam is benign. He does not appear dehydrated. Lab work was obtained which is reassuring. Suspect that his symptoms are related to his diet.  Suggested dietary modifications as well as carafate and zofran for symptomatic control.  FU with GI if no improvement with conservative measures.  Discussed plan with patient, he/she acknowledged understanding and agreed with plan of care.  Return precautions given for new or worsening symptoms.  I personally performed the services described in this documentation, which was scribed in my presence. The recorded information has been reviewed and is accurate.  Garlon HatchetLisa M Adeeb Konecny, PA-C 07/14/14 1354  Azalia BilisKevin Campos, MD 07/14/14 [phone]

## 2014-07-14 NOTE — ED Notes (Addendum)
Pt reports on and off again vomiting sporadically for 2 mos. Today came in bc he saw blood streak in it. Denies belly pain. Reports feels better after he vomits. Denies alcohol use. States he coughs a lot right before.

## 2014-07-14 NOTE — Discharge Instructions (Signed)
Take the prescribed medication as directed top help with nausea/vomiting. Try to watch your diet over the next week or so to see what foods cause symptoms.  Try to swap out some of the fried/fatty foods for healthier choices. Follow-up with GI physician, Dr. Loreta AveMann, if symptoms not improving. Return to the ED for new or worsening symptoms.

## 2014-07-17 ENCOUNTER — Encounter (HOSPITAL_COMMUNITY): Payer: Self-pay | Admitting: Emergency Medicine

## 2014-07-17 ENCOUNTER — Emergency Department (HOSPITAL_COMMUNITY)
Admission: EM | Admit: 2014-07-17 | Discharge: 2014-07-17 | Disposition: A | Discharge status: Home / Self Care | Payer: Self-pay | Attending: Emergency Medicine | Admitting: Emergency Medicine

## 2014-07-17 DIAGNOSIS — J45909 Unspecified asthma, uncomplicated: Secondary | ICD-10-CM | POA: Insufficient documentation

## 2014-07-17 DIAGNOSIS — R011 Cardiac murmur, unspecified: Secondary | ICD-10-CM | POA: Insufficient documentation

## 2014-07-17 DIAGNOSIS — R1110 Vomiting, unspecified: Secondary | ICD-10-CM | POA: Insufficient documentation

## 2014-07-17 DIAGNOSIS — R1111 Vomiting without nausea: Secondary | ICD-10-CM

## 2014-07-17 DIAGNOSIS — Z79899 Other long term (current) drug therapy: Secondary | ICD-10-CM | POA: Insufficient documentation

## 2014-07-17 DIAGNOSIS — Z720 Tobacco use: Secondary | ICD-10-CM | POA: Insufficient documentation

## 2014-07-17 LAB — CBC WITH DIFFERENTIAL/PLATELET
BASOS ABS: 0 10*3/uL (ref 0.0–0.1)
Basophils Relative: 1 % (ref 0–1)
EOS PCT: 0 % (ref 0–5)
Eosinophils Absolute: 0 10*3/uL (ref 0.0–0.7)
HCT: 44.3 % (ref 39.0–52.0)
Hemoglobin: 14.5 g/dL (ref 13.0–17.0)
Lymphocytes Relative: 38 % (ref 12–46)
Lymphs Abs: 2.1 10*3/uL (ref 0.7–4.0)
MCH: 29.1 pg (ref 26.0–34.0)
MCHC: 32.7 g/dL (ref 30.0–36.0)
MCV: 88.8 fL (ref 78.0–100.0)
MONO ABS: 0.4 10*3/uL (ref 0.1–1.0)
Monocytes Relative: 8 % (ref 3–12)
Neutro Abs: 2.9 10*3/uL (ref 1.7–7.7)
Neutrophils Relative %: 53 % (ref 43–77)
Platelets: 178 10*3/uL (ref 150–400)
RBC: 4.99 MIL/uL (ref 4.22–5.81)
RDW: 13.2 % (ref 11.5–15.5)
WBC: 5.5 10*3/uL (ref 4.0–10.5)

## 2014-07-17 LAB — COMPREHENSIVE METABOLIC PANEL
ALK PHOS: 36 U/L — AB (ref 39–117)
ALT: 17 U/L (ref 0–53)
AST: 28 U/L (ref 0–37)
Albumin: 3.9 g/dL (ref 3.5–5.2)
Anion gap: 13 (ref 5–15)
BILIRUBIN TOTAL: 0.9 mg/dL (ref 0.3–1.2)
BUN: 11 mg/dL (ref 6–23)
CHLORIDE: 104 mmol/L (ref 96–112)
CO2: 21 mmol/L (ref 19–32)
Calcium: 9.4 mg/dL (ref 8.4–10.5)
Creatinine, Ser: 1.18 mg/dL (ref 0.50–1.35)
GFR calc Af Amer: >90 mL/min (ref >90)
GFR calc non Af Amer: 79 mL/min — ABNORMAL LOW (ref >90)
GLUCOSE: 84 mg/dL (ref 70–99)
Potassium: 4.2 mmol/L (ref 3.5–5.1)
SODIUM: 138 mmol/L (ref 135–145)
Total Protein: 6.9 g/dL (ref 6.0–8.3)

## 2014-07-17 LAB — LIPASE, BLOOD: LIPASE: 26 U/L (ref 11–59)

## 2014-07-17 MED ORDER — ONDANSETRON HCL 4 MG/2ML IJ SOLN
4.0000 mg | Freq: Once | INTRAMUSCULAR | Status: AC
  Administered 2014-07-17: 4 mg via INTRAVENOUS
  Filled 2014-07-17: qty 2

## 2014-07-17 MED ORDER — FAMOTIDINE IN NACL 20-0.9 MG/50ML-% IV SOLN
20.0000 mg | Freq: Once | INTRAVENOUS | Status: AC
  Administered 2014-07-17: 20 mg via INTRAVENOUS
  Filled 2014-07-17: qty 50

## 2014-07-17 MED ORDER — SUCRALFATE 1 G PO TABS
1.0000 g | ORAL_TABLET | Freq: Once | ORAL | Status: AC
  Administered 2014-07-17: 1 g via ORAL
  Filled 2014-07-17: qty 1

## 2014-07-17 MED ORDER — SODIUM CHLORIDE 0.9 % IV BOLUS (SEPSIS)
1000.0000 mL | Freq: Once | INTRAVENOUS | Status: AC
  Administered 2014-07-17: 1000 mL via INTRAVENOUS

## 2014-07-17 MED ORDER — DICYCLOMINE HCL 10 MG PO CAPS
10.0000 mg | ORAL_CAPSULE | Freq: Once | ORAL | Status: AC
  Administered 2014-07-17: 10 mg via ORAL
  Filled 2014-07-17: qty 1

## 2014-07-17 NOTE — ED Notes (Signed)
Provided ginger ale for the patient to drink at PO challenge.

## 2014-07-17 NOTE — ED Notes (Signed)
Pt aware of need of urine sample. Urinal given. States his pain is better.

## 2014-07-17 NOTE — ED Notes (Signed)
Patient reports he has been nauseous, unable to hold food down. 3 emesis episodes within the last 24 hours. Reports recently being seen for same, but unable to schedule follow up due to wait time of 1 month.

## 2014-07-17 NOTE — ED Provider Notes (Signed)
CSN: [id number]     Arrival date & time 07/17/14  0547 History   None    Chief Complaint  Patient presents with  . Nausea     (Consider location/radiation/quality/duration/timing/severity/associated sxs/prior Treatment) HPI   Todd Randolph is a 34 y.o. male complaining of 3 episodes of emesis over the last 24 hours associated with post emesis epigastric discomfort he denies bilious vomiting but states sometimes there is slight blood streaking. Patient was seen 48 hours ago and given a prescription for Zofran and Carafate which she has not filled. He denies fever, chills, states his stool is slightly more loose than normal, denies melena hematochezia. Patient has had episodes of cyclic vomiting multiple times, has appointment with a gastroenterologist in 1 month. No local primary care, he recently moved.    Past Medical History  Diagnosis Date  . Heart murmur   . Asthma    Past Surgical History  Procedure Laterality Date  . Adenoidectomy    . Tonsillectomy     History reviewed. No pertinent family history. History  Substance Use Topics  . Smoking status: Current Every Day Smoker -- 0.00 packs/day  . Smokeless tobacco: Never Used  . Alcohol Use: No    Review of Systems  10 systems reviewed and found to be negative, except as noted in the HPI.   Allergies  Review of patient's allergies indicates no known allergies.  Home Medications   Prior to Admission medications   Medication Sig Start Date End Date Taking? Authorizing Provider  ibuprofen (ADVIL,MOTRIN) 200 MG tablet Take 800 mg by mouth every 6 (six) hours as needed for mild pain.   Yes Historical Provider, MD  HYDROcodone-acetaminophen (NORCO/VICODIN) 5-325 MG per tablet Take 1 tablet by mouth every 4 (four) hours as needed for moderate pain. Patient not taking: Reported on 07/17/2014 09/23/13   Todd BilisKevin Campos, MD  ondansetron (ZOFRAN ODT) 4 MG disintegrating tablet Take 1 tablet (4 mg total) by mouth every 8 (eight)  hours as needed for nausea. Patient not taking: Reported on 07/17/2014 07/14/14   Todd HatchetLisa M Sanders, PA-C  sucralfate (CARAFATE) 1 G tablet Take 1 tablet (1 g total) by mouth 4 (four) times daily -  with meals and at bedtime. Patient not taking: Reported on 07/17/2014 07/14/14   Todd HatchetLisa M Sanders, PA-C   BP 107/64 mmHg  Pulse 54  Temp(Src) 98.1 F (36.7 C) (Oral)  Resp 17  Ht 5\' 11"  (1.803 m)  Wt 180 lb (81.647 kg)  BMI 25.12 kg/m2  SpO2 100% Physical Exam  Constitutional: He is oriented to person, place, and time. He appears well-developed and well-nourished. No distress.  Patient is comfortable, smiling, watching TV  HENT:  Head: Normocephalic.  Mouth/Throat: Oropharynx is clear and moist.  Eyes: Conjunctivae and EOM are normal.  Cardiovascular: Normal rate, regular rhythm and intact distal pulses.   Pulmonary/Chest: Effort normal and breath sounds normal. No stridor. No respiratory distress. He has no wheezes. He has no rales. He exhibits no tenderness.  Abdominal: Soft. Bowel sounds are normal. He exhibits no distension and no mass. There is no tenderness. There is no rebound and no guarding.  Musculoskeletal: Normal range of motion.  Neurological: He is alert and oriented to person, place, and time.  Psychiatric: He has a normal mood and affect.  Nursing note and vitals reviewed.   ED Course  Procedures (including critical care time) Labs Review Labs Reviewed  COMPREHENSIVE METABOLIC PANEL - Abnormal; Notable for the following:    Alkaline Phosphatase  36 (*)    GFR calc non Af Amer 79 (*)    All other components within normal limits  LIPASE, BLOOD  CBC WITH DIFFERENTIAL/PLATELET    Imaging Review No results found.   EKG Interpretation None      MDM   Final diagnoses:  Non-intractable vomiting without nausea, vomiting of unspecified type    Filed Vitals:   07/17/14 0604 07/17/14 0630 07/17/14 0700 07/17/14 0730  BP: 128/77 126/72 114/63 107/64  Pulse: 61 87 90 54   Temp: 98.1 F (36.7 C)     TempSrc: Oral     Resp: Height:  (1.803 m)     Weight: 180 lb (81.647 kg)     SpO2: 100% 100% 100% 100%    Medications  sodium chloride 0.9 % bolus 1,000 mL (0 mLs Intravenous Stopped 07/17/14 0746)  ondansetron (ZOFRAN) injection 4 mg (4 mg Intravenous Given 07/17/14 0640)  dicyclomine (BENTYL) capsule 10 mg (10 mg Oral Given 07/17/14 0640)  famotidine (PEPCID) IVPB 20 mg (0 mg Intravenous Stopped 07/17/14 0731)  sucralfate (CARAFATE) tablet 1 g (1 g Oral Given 07/17/14 0640)    Todd Randolph is a pleasant 34 y.o. male presenting with 3 episodes of vomiting over the last 24 hours. Patient is comfortable appearing, abdominal exam with no tenderness. Patient appears well-hydrated. He has not filled his prescription for Zofran after his visit 2 days ago. Plan is blood work, by mouth challenge.   Blood work with no abnormalities, patient has by mouth challenge. Serial abdominal exams are benign. Encourage patient to fill his prescription for Zofran and follow up with GI and primary care  Evaluation does not show pathology that would require ongoing emergent intervention or inpatient treatment. Pt is hemodynamically stable and mentating appropriately. Discussed findings and plan with patient/guardian, who agrees with care plan. All questions answered. Return precautions discussed and outpatient follow up given.       Todd Emery, PA-C 07/17/14 0746  Todd Jester, Todd Randolph 07/18/14 [phone]

## 2014-07-17 NOTE — Discharge Instructions (Signed)
Please fill the prescription you have for nausea medication at home.  Push fluids: take small frequent sips of water or Gatorade, do not drink any soda, juice or caffeinated beverages.    Slowly resume solid diet as desired. Avoid food that are spicy, contain dairy and/or have high fat content.  Aviod NSAIDs (aspirin, motrin, ibuprofen, naproxen, Aleve et Karie Soda) for pain control because they will irritate your stomach.  Do not hesitate to return to the emergency room for any new, worsening or concerning symptoms.  Please obtain primary care using resource guide below. But the minute you were seen in the emergency room and that they will need to obtain records for further outpatient management.   Emergency Department Resource Guide 1) Find a Doctor and Pay Out of Pocket Although you won't have to find out who is covered by your insurance plan, it is a good idea to ask around and get recommendations. You will then need to call the office and see if the doctor you have chosen will accept you as a new patient and what types of options they offer for patients who are self-pay. Some doctors offer discounts or will set up payment plans for their patients who do not have insurance, but you will need to ask so you aren't surprised when you get to your appointment.  2) Contact Your Local Health Department Not all health departments have doctors that can see patients for sick visits, but many do, so it is worth a call to see if yours does. If you don't know where your local health department is, you can check in your phone book. The CDC also has a tool to help you locate your state's health department, and many state websites also have listings of all of their local health departments.  3) Find a Walk-in Clinic If your illness is not likely to be very severe or complicated, you may want to try a walk in clinic. These are popping up all over the country in pharmacies, drugstores, and shopping centers.  They're usually staffed by nurse practitioners or physician assistants that have been trained to treat common illnesses and complaints. They're usually fairly quick and inexpensive. However, if you have serious medical issues or chronic medical problems, these are probably not your best option.  No Primary Care Doctor: - Call Health Connect at  [phone] - they can help you locate a primary care doctor that  accepts your insurance, provides certain services, etc. - Physician Referral Service- [phone]  Chronic Pain Problems: Organization         Address  Phone   Notes  [facility]  [phone] Patients need to be referred by their primary care doctor.   Medication Assistance: Organization         Address  Phone   Notes  [facility] Medication [facility] [address] [place], [place] [address] [phone] --Must be a resident of [facility] -- Must have NO insurance coverage whatsoever (no Medicaid/ Medicare, etc.) -- The pt. MUST have a primary care doctor that directs their care regularly and follows them in the community   [facility]ist  [phone]   [employer]  [phone]    Agencies that provide inexpensive medical care: Organization         Address  Phone   Notes  [facility]  [phone]   [facility]    [phone]   [facility]  Outpatient Clinic [place], [place] [address] [phone]   [place] [address], [place] [phone]   Planned Parenthood    [phone]   [facility]    [phone]   [place] and [address], [place] Phone:  [phone], Fax:  [phone] Hours of Operation:  9 am - 6 pm, M-F.  Also accepts Medicaid/Medicare and self-pay.  [facility] for [place], [address], [place] Phone: [phone], Fax: [phone]. Hours of Operation:  8:30 am - 5:30 pm, M-F.  Also accepts Medicaid and self-pay.  [facility] [place] [address], [place] Phone: [phone]   [place], [place], [place] [phone], Ext. [phone] Mondays & Thursdays: 7-9 AM.  First 15 patients are seen on a first come, first serve basis.    [place] Providers:  Organization         Address  Phone   Notes  [facility] [address], [place] [phone] Also accepts self-pay patients.  [facility] [id number] [place], [place]  [phone]   [place], [address], [place] [phone]   [facility] [address] [place] [phone]   Lucianne Lei [address], [place]   [phone] Only accepts [place] Access [place] patients after they have their name applied to their card.   Self-Pay (no insurance) in [facility]:  Organization         Address  Phone   Notes  Sickle Cell Patients, [facility] Internal Medicine [place] [phone]   [facility] Urgent Care [place] [phone]   [facility] [place]  [place], [place], [place] [phone]   [facility]/Dr. Osei-Bonsu  [address] [place] or [address], [place] [phone] Phone number for both [place] and [place] locations is the same.  Urgent Medical and [facility] [address] [place] [phone]   [facility] [address], [place] or [address] [phone]   [facility] [address], [place] [phone], phone; [phone], fax Sees patients 1st and 3rd Saturday of every month.  Must not qualify for public or private insurance (i.e.  Medicaid, Medicare, [place] Health Choice, Veterans' Benefits)  Household income should be no more than 200% of the poverty level The clinic cannot treat you if you are pregnant or think you are pregnant  Sexually transmitted diseases are not treated at the clinic.    Dental Care: Organization         Address  Phone  Notes  [facility] Department of [facility] [place] [phone] Accepts children up to age 59 who are enrolled in [place] or [place]; pregnant women with a Medicaid card; and children who have applied for Medicaid or [place] Health Choice, but were declined, whose parents can pay a reduced fee at time of service.  [facility] Department of [facility]  [address], [place] [phone] Accepts children up to age 64 who are enrolled  in Medicaid or [place] Health Choice; pregnant women with a Medicaid card; and children who have applied for Medicaid or [place] Health Choice, but were declined, whose parents can pay a reduced fee at time of service.  [place] Adult Dental Access PROGRAM  [place] [phone] Patients are seen by appointment only. Walk-ins are not accepted. [place] will see patients 45 years of age and older. Monday - Tuesday (8am-5pm) Most Wednesdays (8:30-5pm) $30 per visit, cash only  [facility] Adult Dental Access PROGRAM  [address], [facility] [phone] Patients are seen by appointment only. Walk-ins are not accepted. [place] will see patients 34 years of age and older. One Wednesday Evening (Monthly: Volunteer Based).  $30 per visit, cash only  [place]  [phone] for adults; Children under age 104, call Graduate Pediatric Dentistry at [phone]. Children aged 45-14, please call [phone] to request a pediatric application.  Dental services are provided in all areas of dental care including fillings,  crowns and bridges, complete and partial dentures, implants, gum treatment, root canals, and extractions. Preventive care is also provided. Treatment is provided to both adults and children. Patients are selected via a lottery and there is often a waiting list.   [facility] [address] [place]  [phone] [email or web address]   [facility] [address] [place], [place] [phone], Ext. [phone] Second and Fourth Thursday of each month, opens at 6:30 AM; Clinic ends at 9 AM.  Patients are seen on a first-come first-served basis, and a limited number are seen during each clinic.   [facility]  [address] Hillard Danker [place], [place] [phone]   Eligibility Requirements You must have lived in [place], [place], or [place] counties for at least the last three months.   You cannot be eligible for state or federal sponsored [employer], including [employer], [place], or [employer].   You generally cannot be eligible for healthcare insurance through your employer.    How to apply: Eligibility screenings are held every Tuesday and Wednesday afternoon from 1:00 pm until 4:00 pm. You do not need an appointment for the interview!  [facility] [address] [place], [place]   [place]  [place] Department  [place]  [phone]    Behavioral Health Resources in the Community: Intensive Outpatient Programs Organization         Address  Phone  Notes  [place]. [address] [place], [place] [phone]   [facility] [address] [place], [place]   ADS: [facility] [address], [place], [place]   [place] [address]  [place], [place] or [phone]   Substance Abuse  Resources Organization         Address  Phone  Notes  Alcohol and Drug Services  [phone]   [place]  [phone]   [facility] [place]   Chinita Pester  [phone]   Residential & Outpatient Substance Abuse Program  [phone]   Psychological Services Organization         Address  Phone  Notes  [facility] [place]  [place]  [phone]   [place] [address], [place]  or [phone]    Mobile Crisis Teams Organization         Address  Phone  Notes  [facility], Mobile Crisis Care Unit  [phone]   [facility]  [address] [place], [place] [phone]   [facility] [address] [place] [phone]    Self-Help/Support Groups Organization         Address  Phone             Notes  [facility]ental [facility]oc. of [place] - variety of support groups  [phone] Call for more information  Narcotics Anonymous (NA), [facility] [address], [place]  2 meetings at this location   Statisticianesidential Treatment Programs Organization         Address  Phone  Notes  [facility] [address],    [place]  [phone]   [facility]  [address], [place] [phone], [place], [place] [phone]   [facility] [address], [place] [phone] Admissions: 8am-3pm M-F  [facility] [address]    [place], [place] [phone]   [facility] [address], [place], [place] [phone]   [facility] [address]  [place], [place] [phone]   [facility] - Intensive Outpatient [address], [place], [place] [phone]   [facility] ([facility] [address]  [place], [place] [phone] or [phone]   [facility] (RT[facility]) [address] [place], [place] [phone] Accepts Medicaid  [facility] [place] [address]  [place] [phone] Substance Abuse/Addiction Treatment   [facility] Behavioral Health Resources Organization         Address  Phone  Notes  [facility]  [phone]) [phone]   Angie FavaJulie Brannon, PhD [address] [place], [place]   [phone](336) [phone] or [phone]   [facility]   [address] [place], [place] [phone]) [phone]   [facility] [address], [place], [place] [phone] Insurance/Medicaid/sponsorship through [facility]  [facility] [address]                                    [place], [place] [phone] Therapy/tele-psych/case  [facility] [address].   [place], [place] [phone]    Dr. Lolly MustacheArfeen  [phone]   [facility] [place]  [employer] [facility] 1) [address],  2) [address], [place] 3)  [address] [place] [address], [place] [phone]) [phone]  [phone]   [facility] Child Abuse Hotline [phone](336) [phone] or [phone](336) [phone] (After Hours)

## 2014-09-06 ENCOUNTER — Encounter (HOSPITAL_COMMUNITY): Payer: Self-pay | Admitting: *Deleted

## 2014-09-06 ENCOUNTER — Emergency Department (HOSPITAL_COMMUNITY): Payer: Self-pay

## 2014-09-06 ENCOUNTER — Emergency Department (HOSPITAL_COMMUNITY)
Admission: EM | Admit: 2014-09-06 | Discharge: 2014-09-06 | Disposition: A | Discharge status: Home / Self Care | Payer: Self-pay | Attending: Emergency Medicine | Admitting: Emergency Medicine

## 2014-09-06 DIAGNOSIS — Z720 Tobacco use: Secondary | ICD-10-CM | POA: Insufficient documentation

## 2014-09-06 DIAGNOSIS — Z8639 Personal history of other endocrine, nutritional and metabolic disease: Secondary | ICD-10-CM | POA: Insufficient documentation

## 2014-09-06 DIAGNOSIS — R0789 Other chest pain: Secondary | ICD-10-CM | POA: Insufficient documentation

## 2014-09-06 DIAGNOSIS — J45909 Unspecified asthma, uncomplicated: Secondary | ICD-10-CM | POA: Insufficient documentation

## 2014-09-06 DIAGNOSIS — R079 Chest pain, unspecified: Secondary | ICD-10-CM

## 2014-09-06 DIAGNOSIS — Z79899 Other long term (current) drug therapy: Secondary | ICD-10-CM | POA: Insufficient documentation

## 2014-09-06 DIAGNOSIS — M549 Dorsalgia, unspecified: Secondary | ICD-10-CM | POA: Insufficient documentation

## 2014-09-06 DIAGNOSIS — R011 Cardiac murmur, unspecified: Secondary | ICD-10-CM | POA: Insufficient documentation

## 2014-09-06 DIAGNOSIS — R112 Nausea with vomiting, unspecified: Secondary | ICD-10-CM | POA: Insufficient documentation

## 2014-09-06 HISTORY — DX: Amyloidosis, unspecified: E85.9

## 2014-09-06 LAB — COMPREHENSIVE METABOLIC PANEL
ALT: 21 U/L (ref 17–63)
AST: 28 U/L (ref 15–41)
Albumin: 4.2 g/dL (ref 3.5–5.0)
Alkaline Phosphatase: 41 U/L (ref 38–126)
Anion gap: 7 (ref 5–15)
BILIRUBIN TOTAL: 0.8 mg/dL (ref 0.3–1.2)
BUN: 13 mg/dL (ref 6–20)
CO2: 27 mmol/L (ref 22–32)
Calcium: 9.4 mg/dL (ref 8.9–10.3)
Chloride: 105 mmol/L (ref 101–111)
Creatinine, Ser: 1.25 mg/dL — ABNORMAL HIGH (ref 0.61–1.24)
GFR calc Af Amer: >60 mL/min (ref >60)
GFR calc non Af Amer: >60 mL/min (ref >60)
Glucose, Bld: 107 mg/dL — ABNORMAL HIGH (ref 65–99)
POTASSIUM: 4 mmol/L (ref 3.5–5.1)
SODIUM: 139 mmol/L (ref 135–145)
Total Protein: 7.5 g/dL (ref 6.5–8.1)

## 2014-09-06 LAB — CBC
HCT: 43 % (ref 39.0–52.0)
Hemoglobin: 14.4 g/dL (ref 13.0–17.0)
MCH: 29.1 pg (ref 26.0–34.0)
MCHC: 33.5 g/dL (ref 30.0–36.0)
MCV: 86.9 fL (ref 78.0–100.0)
Platelets: 183 10*3/uL (ref 150–400)
RBC: 4.95 MIL/uL (ref 4.22–5.81)
RDW: 13.1 % (ref 11.5–15.5)
WBC: 4.8 10*3/uL (ref 4.0–10.5)

## 2014-09-06 LAB — LIPASE, BLOOD: Lipase: 23 U/L (ref 22–51)

## 2014-09-06 LAB — I-STAT TROPONIN, ED: Troponin i, poc: 0 ng/mL (ref 0.00–0.08)

## 2014-09-06 MED ORDER — ONDANSETRON 4 MG PO TBDP
8.0000 mg | ORAL_TABLET | Freq: Once | ORAL | Status: AC
  Administered 2014-09-06: 8 mg via ORAL
  Filled 2014-09-06: qty 2

## 2014-09-06 MED ORDER — ONDANSETRON 8 MG PO TBDP: ORAL_TABLET | ORAL | Status: DC

## 2014-09-06 MED ORDER — ONDANSETRON HCL 4 MG/2ML IJ SOLN
4.0000 mg | Freq: Once | INTRAMUSCULAR | Status: DC
  Filled 2014-09-06: qty 2

## 2014-09-06 MED ORDER — SODIUM CHLORIDE 0.9 % IV BOLUS (SEPSIS): 1000.0000 mL | INTRAVENOUS | Status: DC

## 2014-09-06 NOTE — ED Notes (Signed)
Patient transported to X-ray 

## 2014-09-06 NOTE — Discharge Instructions (Signed)
1. Medications: zofran as needed for vomiting, usual home medications 2. Treatment: rest, drink plenty of fluids, advance diet slowly 3. Follow Up: Please followup with your primary doctor in 2 days for discussion of your diagnoses and further evaluation after today's visit; if you do not have a primary care doctor use the resource guide provided to find one; Please return to the ER for persistent vomiting, high fevers or worsening symptoms   Nausea and Vomiting Nausea is a sick feeling that often comes before throwing up (vomiting). Vomiting is a reflex where stomach contents come out of your mouth. Vomiting can cause severe loss of body fluids (dehydration). Children and elderly adults can become dehydrated quickly, especially if they also have diarrhea. Nausea and vomiting are symptoms of a condition or disease. It is important to find the cause of your symptoms. CAUSES   Direct irritation of the stomach lining. This irritation can result from increased acid production (gastroesophageal reflux disease), infection, food poisoning, taking certain medicines (such as nonsteroidal anti-inflammatory drugs), alcohol use, or tobacco use.  Signals from the brain.These signals could be caused by a headache, heat exposure, an inner ear disturbance, increased pressure in the brain from injury, infection, a tumor, or a concussion, pain, emotional stimulus, or metabolic problems.  An obstruction in the gastrointestinal tract (bowel obstruction).  Illnesses such as diabetes, hepatitis, gallbladder problems, appendicitis, kidney problems, cancer, sepsis, atypical symptoms of a heart attack, or eating disorders.  Medical treatments such as chemotherapy and radiation.  Receiving medicine that makes you sleep (general anesthetic) during surgery. DIAGNOSIS Your caregiver may ask for tests to be done if the problems do not improve after a few days. Tests may also be done if symptoms are severe or if the reason  for the nausea and vomiting is not clear. Tests may include:  Urine tests.  Blood tests.  Stool tests.  Cultures (to look for evidence of infection).  X-rays or other imaging studies. Test results can help your caregiver make decisions about treatment or the need for additional tests. TREATMENT You need to stay well hydrated. Drink frequently but in small amounts.You may wish to drink water, sports drinks, clear broth, or eat frozen ice pops or gelatin dessert to help stay hydrated.When you eat, eating slowly may help prevent nausea.There are also some antinausea medicines that may help prevent nausea. HOME CARE INSTRUCTIONS   Take all medicine as directed by your caregiver.  If you do not have an appetite, do not force yourself to eat. However, you must continue to drink fluids.  If you have an appetite, eat a normal diet unless your caregiver tells you differently.  Eat a variety of complex carbohydrates (rice, wheat, potatoes, bread), lean meats, yogurt, fruits, and vegetables.  Avoid high-fat foods because they are more difficult to digest.  Drink enough water and fluids to keep your urine clear or pale yellow.  If you are dehydrated, ask your caregiver for specific rehydration instructions. Signs of dehydration may include:  Severe thirst.  Dry lips and mouth.  Dizziness.  Dark urine.  Decreasing urine frequency and amount.  Confusion.  Rapid breathing or pulse. SEEK IMMEDIATE MEDICAL CARE IF:   You have blood or brown flecks (like coffee grounds) in your vomit.  You have black or bloody stools.  You have a severe headache or stiff neck.  You are confused.  You have severe abdominal pain.  You have chest pain or trouble breathing.  You do not urinate at least once  every 8 hours.  You develop cold or clammy skin.  You continue to vomit for longer than 24 to 48 hours.  You have a fever. MAKE SURE YOU:   Understand these instructions.  Will  watch your condition.  Will get help right away if you are not doing well or get worse. Document Released: 03/20/2005 Document Revised: 06/12/2011 Document Reviewed: 08/17/2010 [facility] Patient Information 2015 [employer], [place]. This information is not intended to replace advice given to you by your health care provider. Make sure you discuss any questions you have with your health care provider.

## 2014-09-06 NOTE — ED Provider Notes (Signed)
CSN: [id number]     Arrival date & time 09/06/14  1335 History   First MD Initiated Contact with Patient 09/06/14 1612     Chief Complaint  Patient presents with  . Emesis  . Chest Pain     (Consider location/radiation/quality/duration/timing/severity/associated sxs/prior Treatment) The history is provided by the patient and medical records. No language interpreter was used.     Todd Randolph is a 34 y.o. male  with a hx of heart murmur, asthma, amyloidosis presents to the Emergency Department complaining of gradual, persistent, progressively worsening NBNB emesis x10 onset 2 days.  Last emesis was just prior to arrival.  Pt reports abdominal, back and chest soreness from vomiting, but no focal pain.   No treatments PTA.  Nothing makes it better and nothing makes it worse.  Pt denies abdominal surgeries. Associated symptoms include a persistent hacking cough along with a rhinorrhea x 1 week.  Pt denies fever, chills, headache, neck pain, SOB, diarrhea, weakness, dizziness, syncope.  PT denies EtOH and drug usage.  He smokes 10-15 cigarettes per day.  He denies recent travel hx, swelling of his legs, palpitations, hemoptysis, or estrogen usage.     Past Medical History  Diagnosis Date  . Heart murmur   . Asthma   . Amyloidosis    Past Surgical History  Procedure Laterality Date  . Adenoidectomy    . Tonsillectomy     No family history on file. History  Substance Use Topics  . Smoking status: Current Every Day Smoker -- 1.00 packs/day    Types: Cigarettes  . Smokeless tobacco: Never Used  . Alcohol Use: No    Review of Systems  Constitutional: Negative for fever, diaphoresis, appetite change, fatigue and unexpected weight change.  HENT: Negative for mouth sores.   Eyes: Negative for visual disturbance.  Respiratory: Positive for cough and chest tightness. Negative for shortness of breath and wheezing.   Cardiovascular: Negative for chest pain.  Gastrointestinal: Positive for  nausea and vomiting. Negative for abdominal pain, diarrhea and constipation.  Endocrine: Negative for polydipsia, polyphagia and polyuria.  Genitourinary: Negative for dysuria, urgency, frequency and hematuria.  Musculoskeletal: Positive for back pain (soreness). Negative for neck stiffness.  Skin: Negative for rash.  Allergic/Immunologic: Negative for immunocompromised state.  Neurological: Negative for syncope, light-headedness and headaches.  Hematological: Does not bruise/bleed easily.  Psychiatric/Behavioral: Negative for sleep disturbance. The patient is not nervous/anxious.       Allergies  Review of patient's allergies indicates no known allergies.  Home Medications   Prior to Admission medications   Medication Sig Start Date End Date Taking? Authorizing Provider  HYDROcodone-acetaminophen (NORCO/VICODIN) 5-325 MG per tablet Take 1 tablet by mouth every 4 (four) hours as needed for moderate pain. Patient not taking: Reported on 07/17/2014 09/23/13   Azalia Bilis, MD  ondansetron (ZOFRAN ODT) 4 MG disintegrating tablet Take 1 tablet (4 mg total) by mouth every 8 (eight) hours as needed for nausea. Patient not taking: Reported on 07/17/2014 07/14/14   Garlon Hatchet, PA-C  sucralfate (CARAFATE) 1 G tablet Take 1 tablet (1 g total) by mouth 4 (four) times daily -  with meals and at bedtime. Patient not taking: Reported on 07/17/2014 07/14/14   Garlon Hatchet, PA-C   BP 120/62 mmHg  Pulse 73  Temp(Src) 98.9 F (37.2 C) (Oral)  Resp 18  Ht  (1.778 m)  Wt 175 lb (79.379 kg)  BMI 25.11 kg/m2  SpO2 96% Physical Exam  Constitutional: He appears  well-developed and well-nourished. No distress.  Awake, alert, nontoxic appearance  HENT:  Head: Normocephalic and atraumatic.  Mouth/Throat: Oropharynx is clear and moist. No oropharyngeal exudate.  Eyes: Conjunctivae are normal. No scleral icterus.  Neck: Normal range of motion. Neck supple.  Cardiovascular: Normal rate, regular  rhythm, normal heart sounds and intact distal pulses.   Pulmonary/Chest: Effort normal and breath sounds normal. No respiratory distress. He has no wheezes. He exhibits tenderness (mild anterior).  Equal chest expansion  Abdominal: Soft. Bowel sounds are normal. He exhibits no distension and no mass. There is no tenderness. There is no rebound and no guarding.  Musculoskeletal: Normal range of motion. He exhibits no edema.  Neurological: He is alert.  Speech is clear and goal oriented Moves extremities without ataxia  Skin: Skin is warm and dry. He is not diaphoretic.  Psychiatric: He has a normal mood and affect.  Nursing note and vitals reviewed.   ED Course  Procedures (including critical care time) Labs Review Labs Reviewed  COMPREHENSIVE METABOLIC PANEL - Abnormal; Notable for the following:    Glucose, Bld 107 (*)    Creatinine, Ser 1.25 (*)    All other components within normal limits  CBC  LIPASE, BLOOD  I-STAT TROPOININ, ED    Imaging Review Dg Chest 2 View  09/06/2014   CLINICAL DATA:  Chest pain when vomiting for the past 2 days.  EXAM: CHEST  2 VIEW  COMPARISON:  09/23/2013  FINDINGS: The heart size and mediastinal contours are within normal limits. Both lungs are clear. The visualized skeletal structures are unremarkable.  IMPRESSION: Normal chest x-ray.   Electronically Signed   By: Rudie MeyerP.  Gallerani M.D.   On: 09/06/2014 16:54     EKG Interpretation   Date/Time:  Sunday September 06 2014 13:52:35 EDT Ventricular Rate:  75 PR Interval:  150 QRS Duration: 92 QT Interval:  384 QTC Calculation: 428 R Axis:   79 Text Interpretation:  Normal sinus rhythm Normal ECG No significant change  since last tracing Confirmed by YAO  MD, DAVID ([id number]) on 09/06/2014 4:36:09  PM         MDM   Final diagnoses:  Chest pain  Non-intractable vomiting with nausea, vomiting of unspecified type   Todd MoundRicardo Randolph presents with 2 days of emesis with cough.  On exam his abd is soft and  nontender.  He reports mild TTP of the anterior chest without visible discomfort.  Vitals are stable. PERC negative.  Lipase and CXR pending.  ECG nonischemic.     5:39 PM Refused IV fluid hydration. Given Zofran by mouth. He is tolerating fluids here in the emergency department without difficulty. Chest pain is reproducible with palpation and movement.  Negative troponin, normal x-ray and normal EKG. PERC negative and no cardiac history.  Pt abd soft and nontender on initial exam and has remained that way throughout. He denies lightheadedness or near-syncope. Patient reports he feels where L and wishes for discharge home. Vitals have remained stable without tachycardia.  No indication of appendicitis, bowel obstruction, bowel perforation, cholecystitis, diverticulitis.    BP 120/62 mmHg  Pulse 73  Temp(Src) 98.9 F (37.2 C) (Oral)  Resp 18  Ht 5\' 10"  (1.778 m)  Wt 175 lb (79.379 kg)  BMI 25.11 kg/m2  SpO2 96%   Dierdre ForthHannah Thora Scherman, PA-C 09/06/14 1741  Richardean Canalavid H Yao, MD 09/07/14 [phone]

## 2014-09-06 NOTE — ED Notes (Addendum)
PT states emesis for 2 days (10 x's per day).  Denies fevers or diarrhea, but states all over body cramps.  Pt is also experiencing chest tightness.

## 2015-01-01 ENCOUNTER — Encounter (HOSPITAL_COMMUNITY): Payer: Self-pay

## 2015-01-01 ENCOUNTER — Emergency Department (HOSPITAL_COMMUNITY)
Admission: EM | Admit: 2015-01-01 | Discharge: 2015-01-01 | Disposition: A | Discharge status: Home / Self Care | Payer: Self-pay | Attending: Emergency Medicine | Admitting: Emergency Medicine

## 2015-01-01 DIAGNOSIS — R011 Cardiac murmur, unspecified: Secondary | ICD-10-CM | POA: Insufficient documentation

## 2015-01-01 DIAGNOSIS — R112 Nausea with vomiting, unspecified: Secondary | ICD-10-CM | POA: Insufficient documentation

## 2015-01-01 DIAGNOSIS — J45909 Unspecified asthma, uncomplicated: Secondary | ICD-10-CM | POA: Insufficient documentation

## 2015-01-01 DIAGNOSIS — R109 Unspecified abdominal pain: Secondary | ICD-10-CM | POA: Insufficient documentation

## 2015-01-01 DIAGNOSIS — Z79899 Other long term (current) drug therapy: Secondary | ICD-10-CM | POA: Insufficient documentation

## 2015-01-01 DIAGNOSIS — Z720 Tobacco use: Secondary | ICD-10-CM | POA: Insufficient documentation

## 2015-01-01 LAB — URINALYSIS, ROUTINE W REFLEX MICROSCOPIC
Bilirubin Urine: NEGATIVE
Glucose, UA: NEGATIVE mg/dL
Hgb urine dipstick: NEGATIVE
Ketones, ur: 15 mg/dL — AB
Leukocytes, UA: NEGATIVE
Nitrite: NEGATIVE
Protein, ur: NEGATIVE mg/dL
Specific Gravity, Urine: 1.014 (ref 1.005–1.030)
Urobilinogen, UA: 1 mg/dL (ref 0.0–1.0)
pH: 5.5 (ref 5.0–8.0)

## 2015-01-01 LAB — CBC
HCT: 45.9 % (ref 39.0–52.0)
Hemoglobin: 15.1 g/dL (ref 13.0–17.0)
MCH: 29.1 pg (ref 26.0–34.0)
MCHC: 32.9 g/dL (ref 30.0–36.0)
MCV: 88.4 fL (ref 78.0–100.0)
Platelets: 192 10*3/uL (ref 150–400)
RBC: 5.19 MIL/uL (ref 4.22–5.81)
RDW: 13.6 % (ref 11.5–15.5)
WBC: 6.1 10*3/uL (ref 4.0–10.5)

## 2015-01-01 LAB — COMPREHENSIVE METABOLIC PANEL
ALT: 33 U/L (ref 17–63)
AST: 32 U/L (ref 15–41)
Albumin: 4.3 g/dL (ref 3.5–5.0)
Alkaline Phosphatase: 37 U/L — ABNORMAL LOW (ref 38–126)
Anion gap: 9 (ref 5–15)
BUN: 6 mg/dL (ref 6–20)
CO2: 26 mmol/L (ref 22–32)
Calcium: 10.1 mg/dL (ref 8.9–10.3)
Chloride: 104 mmol/L (ref 101–111)
Creatinine, Ser: 1.27 mg/dL — ABNORMAL HIGH (ref 0.61–1.24)
GFR calc Af Amer: >60 mL/min (ref >60)
GFR calc non Af Amer: >60 mL/min (ref >60)
Glucose, Bld: 92 mg/dL (ref 65–99)
Potassium: 4.4 mmol/L (ref 3.5–5.1)
Sodium: 139 mmol/L (ref 135–145)
Total Bilirubin: 1 mg/dL (ref 0.3–1.2)
Total Protein: 7.2 g/dL (ref 6.5–8.1)

## 2015-01-01 LAB — LIPASE, BLOOD: Lipase: 20 U/L — ABNORMAL LOW (ref 22–51)

## 2015-01-01 MED ORDER — PROMETHAZINE HCL 25 MG PO TABS: 25.0000 mg | ORAL_TABLET | Freq: Four times a day (QID) | ORAL | Status: DC | PRN

## 2015-01-01 MED ORDER — ONDANSETRON 4 MG PO TBDP
ORAL_TABLET | ORAL | Status: AC
  Filled 2015-01-01: qty 1

## 2015-01-01 MED ORDER — ONDANSETRON 4 MG PO TBDP
4.0000 mg | ORAL_TABLET | Freq: Once | ORAL | Status: AC | PRN
  Administered 2015-01-01: 4 mg via ORAL

## 2015-01-01 NOTE — ED Notes (Signed)
Pt presents with 2 month h/o nausea and vomiting.  Pt denies any abdominal pain at present, denies any diarrhea.  Pt reports he has been seen for same with no diagnosis.

## 2015-01-01 NOTE — Discharge Instructions (Signed)
Nausea and Vomiting °Nausea is a sick feeling that often comes before throwing up (vomiting). Vomiting is a reflex where stomach contents come out of your mouth. Vomiting can cause severe loss of body fluids (dehydration). Children and elderly adults can become dehydrated quickly, especially if they also have diarrhea. Nausea and vomiting are symptoms of a condition or disease. It is important to find the cause of your symptoms. °CAUSES  °· Direct irritation of the stomach lining. This irritation can result from increased acid production (gastroesophageal reflux disease), infection, food poisoning, taking certain medicines (such as nonsteroidal anti-inflammatory drugs), alcohol use, or tobacco use. °· Signals from the brain. These signals could be caused by a headache, heat exposure, an inner ear disturbance, increased pressure in the brain from injury, infection, a tumor, or a concussion, pain, emotional stimulus, or metabolic problems. °· An obstruction in the gastrointestinal tract (bowel obstruction). °· Illnesses such as diabetes, hepatitis, gallbladder problems, appendicitis, kidney problems, cancer, sepsis, atypical symptoms of a heart attack, or eating disorders. °· Medical treatments such as chemotherapy and radiation. °· Receiving medicine that makes you sleep (general anesthetic) during surgery. °DIAGNOSIS °Your caregiver may ask for tests to be done if the problems do not improve after a few days. Tests may also be done if symptoms are severe or if the reason for the nausea and vomiting is not clear. Tests may include: °· Urine tests. °· Blood tests. °· Stool tests. °· Cultures (to look for evidence of infection). °· X-rays or other imaging studies. °Test results can help your caregiver make decisions about treatment or the need for additional tests. °TREATMENT °You need to stay well hydrated. Drink frequently but in small amounts. You may wish to drink water, sports drinks, clear broth, or eat frozen  ice pops or gelatin dessert to help stay hydrated. When you eat, eating slowly may help prevent nausea. There are also some antinausea medicines that may help prevent nausea. °HOME CARE INSTRUCTIONS  °· Take all medicine as directed by your caregiver. °· If you do not have an appetite, do not force yourself to eat. However, you must continue to drink fluids. °· If you have an appetite, eat a normal diet unless your caregiver tells you differently. °¨ Eat a variety of complex carbohydrates (rice, wheat, potatoes, bread), lean meats, yogurt, fruits, and vegetables. °¨ Avoid high-fat foods because they are more difficult to digest. °· Drink enough water and fluids to keep your urine clear or pale yellow. °· If you are dehydrated, ask your caregiver for specific rehydration instructions. Signs of dehydration may include: °¨ Severe thirst. °¨ Dry lips and mouth. °¨ Dizziness. °¨ Dark urine. °¨ Decreasing urine frequency and amount. °¨ Confusion. °¨ Rapid breathing or pulse. °SEEK IMMEDIATE MEDICAL CARE IF:  °· You have blood or brown flecks (like coffee grounds) in your vomit. °· You have black or bloody stools. °· You have a severe headache or stiff neck. °· You are confused. °· You have severe abdominal pain. °· You have chest pain or trouble breathing. °· You do not urinate at least once every 8 hours. °· You develop cold or clammy skin. °· You continue to vomit for longer than 24 to 48 hours. °· You have a fever. °MAKE SURE YOU:  °· Understand these instructions. °· Will watch your condition. °· Will get help right away if you are not doing well or get worse. °Document Released: 03/20/2005 Document Revised: 06/12/2011 Document Reviewed: 08/17/2010 °ExitCare® Patient Information ©2015 [employer]. This information is not intended   to replace advice given to you by your health care provider. Make sure you discuss any questions you have with your health care provider. ° ° °Emergency Department Resource Guide °1) Find a  Doctor and Pay Out of Pocket °Although you won't have to find out who is covered by your insurance plan, it is a good idea to ask around and get recommendations. You will then need to call the office and see if the doctor you have chosen will accept you as a new patient and what types of options they offer for patients who are self-pay. Some doctors offer discounts or will set up payment plans for their patients who do not have insurance, but you will need to ask so you aren't surprised when you get to your appointment. ° °2) Contact Your Local Health Department °Not all health departments have doctors that can see patients for sick visits, but many do, so it is worth a call to see if yours does. If you don't know where your local health department is, you can check in your phone book. The CDC also has a tool to help you locate your state's health department, and many state websites also have listings of all of their local health departments. ° °3) Find a Walk-in Clinic °If your illness is not likely to be very severe or complicated, you may want to try a walk in clinic. These are popping up all over the country in pharmacies, drugstores, and shopping centers. They're usually staffed by nurse practitioners or physician assistants that have been trained to treat common illnesses and complaints. They're usually fairly quick and inexpensive. However, if you have serious medical issues or chronic medical problems, these are probably not your best option. ° °No Primary Care Doctor: °- Call [facility] at  832-8000 - they can help you locate a primary care doctor that  accepts your insurance, provides certain services, etc. °- Physician Referral Service- 1-800-533-3463 ° °Chronic Pain Problems: °Organization         Address  Phone   Notes  °[facility]  (336) 297-2271 Patients need to be referred by their primary care doctor.  ° °Medication Assistance: °Organization         Address  Phone    Notes  °[place] [facility] [address] °[place], [place] [address] (336) 641-8030 --Must be a resident of [place] °-- Must have NO insurance coverage whatsoever (no Medicaid/ Medicare, etc.) °-- The pt. MUST have a primary care doctor that directs their care regularly and follows them in the community °  °[facility]  (866) 331-1348   °[employer]  (888) 892-1162   ° °Agencies that provide inexpensive medical care: °Organization         Address  Phone   Notes  °[facility]  (336) 832-8035   °[facility]    (336) 832-7272   °[facility] [address] °[place], [place] [address] (336) 832-4777   °[facility] [address], °[place] (336) 271-4999   °[facility]    (336) 373-0678   °[facility]    (336) 272-1050   °[facility] ° [address], [place] Phone:  (336) 832-4444, Fax:  (336) 832-4440 Hours of Operation:  9 am - 6 pm, M-F.  Also accepts Medicaid/Medicare and self-pay.  °[facility] ° [address], [place]   Phone: (336) 832-3150, Fax: (336) [phone]. Hours of Operation:  8:30 am - 5:30 pm, M-F.  Also accepts Medicaid and self-pay.  °[facility] [place] [address], [place] Phone: (336) 878-6027   °[facility] [address], [place], [place] (336)723-1848, Ext. 123 Mondays & Thursdays: 7-9 AM.  First 15 patients are seen on a first come, first serve basis. °  ° °Medicaid-accepting [place] Providers: ° °Organization         Address  Phone   Notes  °[facility] [address], [place] (336) 641-2100 Also accepts self-pay patients.  °[facility] [address], [place] ° (336) 856-9996   °[facility] [address], [place] (336) 288-8857   °[facility] [address] [place] [address], [place] (336) 299-7000   °Veita Bland [address], [place]  ° (336) 373-1557 Only accepts [place] Access Medicaid patients after they have their name applied to their card.  ° °Self-Pay (no insurance) in [place]: ° °Organization         Address  Phone   Notes  °Sickle Cell Patients, [facility] [address], [place] (336) 832-1970   °[facility] [address], [place] (336) 832-4400   ° [facility] ° [address] [place] [address], [place] (336) 992-4800   °[facility]/Dr. Osei-Bonsu ° [address] [place] [address], [place] or [address], [place] (336) [phone] Phone number for both [place] and [place] locations is the same.  °[facility] [address], [place] (336) 299-0000   °[facility] [address] [place] [address], [place] or [address] (336) 852-7530 °(336) 878-2260   °[facility] [address], [place] (336) 350-1642, phone; (336) 294-5005, fax Sees patients 1st and 3rd Saturday of every month.  Must not qualify for public or private insurance (i.e. Medicaid, Medicare, [place] Health Choice, Veterans' Benefits) • Household income should be no more than 200% of the poverty level •The clinic cannot treat you if you are pregnant or think you are pregnant • Sexually transmitted diseases are not treated at the clinic.  ° ° °Dental Care: °Organization         Address  Phone  Notes  °[facility] [address], [place] (336) 641-6152 Accepts children up to age 21 who are enrolled in Medicaid or [place] Health Choice; pregnant women with a Medicaid card; and children who have applied for Medicaid or [place] Health Choice, but were declined, whose parents can pay a reduced fee at time of service.  °[facility] [place]  [address], [place] (336) 641-7733 Accepts children up to age 21 who are enrolled in Medicaid or [place] Health Choice; pregnant women with a Medicaid card; and children who have applied for Medicaid or [place] Health Choice, but were declined, whose parents can pay a reduced fee at time of service.  °[facility] ° [address],  (336) 641-4533 Patients are seen by appointment only. Walk-ins are not accepted. [facility] will see patients 18 years of age and older. °Monday - Tuesday (8am-5pm) °Most Wednesdays (8:30-5pm) °$30 per visit, cash only  °[facility] ° [address], [place] (336) 641-4533 Patients are seen by appointment only. Walk-ins are not accepted. [facility] will see patients 18 years of age and older. °One Wednesday Evening (Monthly: Volunteer Based).  $30 per visit, cash only  °[facility]  (919) 537-3737 for adults; Children under age 4, call Graduate Pediatric Dentistry at ([phone]) [phone]. Children aged 4-14, please call (919) [phone] to request a pediatric application. ° Dental services are provided in all areas of dental care including fillings, crowns and bridges, complete and partial dentures, implants, gum treatment, root canals, and extractions. Preventive care is also provided. Treatment is provided to both adults and children. °Patients are selected via a lottery and there is often a waiting list. °  °[facility] [address], °[place] ° (336) 763-8833 [email or web address] °  °[facility] [address], [place], [place] (336)723-1848, Ext. 123 Second and Fourth Thursday of each month, opens at 6:30 AM; Clinic ends at 9 AM.  Patients are seen on a first-come first-served basis, and a limited number are seen during each clinic.  ° °[facility] ° [address], [place], [place] (336) 723-7904   Eligibility Requirements °You must have lived in  [place], [place], or [place] counties for at least the last three months. °  You cannot be eligible for state or federal sponsored healthcare insurance, including Veterans Administration, Medicaid, or Medicare. °  You generally cannot be eligible for healthcare insurance through your employer.  °  How to apply: °Eligibility screenings are held every Tuesday and Wednesday afternoon from 1:00 pm until 4:00 pm. You do not need an appointment for the interview!  °[facility] [address], [place], [place] 336-631-2330   °[facility]  336-342-8273   °[place] [facility]  336-703-3100   ° [facility]  336-570-6415   ° °Behavioral Health Resources in the Community: °Intensive Outpatient Programs °Organization         Address  Phone  Notes  °[facility] [address], [place], [place] 336-878-6098   °[facility] [address], [place], [place] 336-832-9800   °[facility] [address], [place], [place] ° 336-882-2125   °[facility] [address],  °[place], [place] 1-800-853-5163 or 336-641-4981   °Substance Abuse Resources °Organization         Address  Phone  Notes  °[facility]  336-882-2125   °[facility]  336-784-9470   °[facility]  336-285-9073   °[facility]  336-845-3988   °Residential & Outpatient Substance Abuse Program  1-800-659-3381   °Psychological Services °Organization         Address  Phone  Notes  °[facility]  336- 832-9600   °[facility]  336- 378-7881   °[facility] [address], [place] 1-800-853-5163 or 336-641-4981   ° °Mobile Crisis Teams °Organization         Address  Phone  Notes  °[facility], Mobile Crisis Care Unit  1-877-626-1772   °Assertive °[facility] ° [address] [place], [place] 336-834-9664   °Sharon DeEsch [address] °[place]  336-554-5454   ° °Self-Help/Support Groups °Organization         Address  Phone             Notes  °[facility] - variety of   support groups  336- 373-1402 Call for more information  °Narcotics Anonymous (NA), [facility] [address], °[place]  2 meetings at this location  ° °Residential Treatment Programs °Organization         Address  Phone  Notes  °[facility] [address],    °[place]  1-866-801-8205   °[facility] ° [address], [place], [place] 704-293-8524   °[facility] [address], [place] 336-845-3988 Admissions: 8am-3pm M-F  °[facility] [address]    °[place], [place] 336-841-1104   °[facility] [address], [place], [place] 336-379-7146   °[facility] [address]  °[place], [place] 336-285-9073   °[facility] - Intensive Outpatient [address], [place], [place] 336-852-3033   °[facility] [address]  °[place], [place] 1-877-615-2722 or 336-784-9470   °[facility] [address] [place], [place] 336-227-7417 Accepts Medicaid  °[facility] [address]  °[place] 1-800-659-3381 Substance Abuse/Addiction Treatment  ° °[place] Behavioral Health Resources °Organization         Address  Phone  Notes  °[facility]  (888) 581-9988   °Julie Brannon, PhD [address] [place], [place]   (336) 349-5553 or (336) 951-0000   °[facility]   [address] °[place], [place] (336) 349-4454   °[facility] [address], [place], [place] (336) 342-8316 Insurance/Medicaid/sponsorship through [facility]  °[facility] [address]                                    [place], [place] (336) 342-8316 Therapy/tele-psych/case  °[facility] [address]  ° [place], [place] (336)  349-2233    °Dr. Arfeen  (336) 349-4544   °[facility] [place]  [employer] [place] [facility] 1) [address], [place] °2) [address], [place] °3)  [address], [place] (336) 349-3220 °(336) 342-7768 ° °(336) 342-8140   °[place] [facility] (336) 342-1394 or (336) 342-3537 (After Hours)    ° ° ° °

## 2015-01-01 NOTE — ED Notes (Signed)
Called pt's name in lobby, unable to locate.  

## 2015-01-10 NOTE — ED Provider Notes (Signed)
CSN: [id number]     Arrival date & time 01/01/15  1313 History   First MD Initiated Contact with Patient 01/01/15 1654     No chief complaint on file.    (Consider location/radiation/quality/duration/timing/severity/associated sxs/prior Treatment) HPI   34 year old male with nausea and vomiting. Intermittent over the past 2 months.  abdominal pain. Symptoms wax and wane. Patient reports previous evaluations for similar with no clear etiology. Is not seen related to food. No respiratory complaints. No urinary complaints. No fevers or chills.     Past Medical History  Diagnosis Date  . Heart murmur   . Asthma   . Amyloidosis    Past Surgical History  Procedure Laterality Date  . Adenoidectomy    . Tonsillectomy     History reviewed. No pertinent family history. Social History  Substance Use Topics  . Smoking status: Current Every Day Smoker -- 1.00 packs/day    Types: Cigarettes  . Smokeless tobacco: Never Used  . Alcohol Use: No    Review of Systems  All systems reviewed and negative, other than as noted in HPI.   Allergies  Review of patient's allergies indicates no known allergies.  Home Medications   Prior to Admission medications   Medication Sig Start Date End Date Taking? Authorizing Provider  HYDROcodone-acetaminophen (NORCO/VICODIN) 5-325 MG per tablet Take 1 tablet by mouth every 4 (four) hours as needed for moderate pain. Patient not taking: Reported on 07/17/2014 09/23/13   Azalia Bilis, MD  ondansetron [facility] ODT) 8 MG disintegrating tablet  ODT q4 hours prn nausea 09/06/14   Dahlia Client Muthersbaugh, PA-C  promethazine (PHENERGAN) 25 MG tablet Take 1 tablet (25 mg total) by mouth every 6 (six) hours as needed for nausea or vomiting. 01/01/15   Raeford Razor, MD  sucralfate (CARAFATE) 1 G tablet Take 1 tablet (1 g total) by mouth 4 (four) times daily -  with meals and at bedtime. Patient not taking: Reported on 07/17/2014 07/14/14   Garlon Hatchet, PA-C   BP  135/79 mmHg  Pulse 73  Temp(Src) 98.8 F (37.1 C) (Oral)  Resp 18  Ht 6' (1.829 m)  Wt 165 lb (74.844 kg)  BMI 22.37 kg/m2  SpO2 98% Physical Exam  Constitutional: He appears well-developed and well-nourished. No distress.  HENT:  Head: Normocephalic and atraumatic.  Eyes: Conjunctivae are normal. Right eye exhibits no discharge. Left eye exhibits no discharge.  Neck: Neck supple.  Cardiovascular: Normal rate, regular rhythm and normal heart sounds.  Exam reveals no gallop and no friction rub.   No murmur heard. Pulmonary/Chest: Effort normal and breath sounds normal. No respiratory distress.  Abdominal: Soft. He exhibits no distension. There is no tenderness.  Musculoskeletal: He exhibits no edema or tenderness.  Neurological: He is alert.  Skin: Skin is warm and dry.  Psychiatric: He has a normal mood and affect. His behavior is normal. Thought content normal.  Nursing note and vitals reviewed.   ED Course  Procedures (including critical care time) Labs Review Labs Reviewed  LIPASE, BLOOD - Abnormal; Notable for the following:    Lipase 20 (*)    All other components within normal limits  COMPREHENSIVE METABOLIC PANEL - Abnormal; Notable for the following:    Creatinine, Ser 1.27 (*)    Alkaline Phosphatase 37 (*)    All other components within normal limits  URINALYSIS, ROUTINE W REFLEX MICROSCOPIC (NOT AT [facility]) - Abnormal; Notable for the following:    Ketones, ur 15 (*)    All  other components within normal limits  CBC    Imaging Review No results found. I have personally reviewed and evaluated these images and lab results as part of my medical decision-making.   EKG Interpretation None      MDM   Final diagnoses:  Non-intractable vomiting with nausea, vomiting of unspecified type        Raeford Razor, MD 01/10/15 0011

## 2015-04-18 ENCOUNTER — Encounter (HOSPITAL_COMMUNITY): Payer: Self-pay

## 2015-04-18 ENCOUNTER — Emergency Department (HOSPITAL_COMMUNITY)
Admission: EM | Admit: 2015-04-18 | Discharge: 2015-04-19 | Disposition: A | Discharge status: Home / Self Care | Payer: Self-pay | Attending: Emergency Medicine | Admitting: Emergency Medicine

## 2015-04-18 DIAGNOSIS — R011 Cardiac murmur, unspecified: Secondary | ICD-10-CM | POA: Insufficient documentation

## 2015-04-18 DIAGNOSIS — F17210 Nicotine dependence, cigarettes, uncomplicated: Secondary | ICD-10-CM | POA: Insufficient documentation

## 2015-04-18 DIAGNOSIS — Z8639 Personal history of other endocrine, nutritional and metabolic disease: Secondary | ICD-10-CM | POA: Insufficient documentation

## 2015-04-18 DIAGNOSIS — R112 Nausea with vomiting, unspecified: Secondary | ICD-10-CM | POA: Insufficient documentation

## 2015-04-18 DIAGNOSIS — J45909 Unspecified asthma, uncomplicated: Secondary | ICD-10-CM | POA: Insufficient documentation

## 2015-04-18 LAB — CBC WITH DIFFERENTIAL/PLATELET
Basophils Absolute: 0 10*3/uL (ref 0.0–0.1)
Basophils Relative: 0 %
Eosinophils Absolute: 0 10*3/uL (ref 0.0–0.7)
Eosinophils Relative: 1 %
HEMATOCRIT: 38.4 % — AB (ref 39.0–52.0)
Hemoglobin: 13 g/dL (ref 13.0–17.0)
LYMPHS PCT: 37 %
Lymphs Abs: 1.7 10*3/uL (ref 0.7–4.0)
MCH: 30 pg (ref 26.0–34.0)
MCHC: 33.9 g/dL (ref 30.0–36.0)
MCV: 88.7 fL (ref 78.0–100.0)
MONO ABS: 0.2 10*3/uL (ref 0.1–1.0)
MONOS PCT: 4 %
NEUTROS ABS: 2.6 10*3/uL (ref 1.7–7.7)
Neutrophils Relative %: 58 %
Platelets: 167 10*3/uL (ref 150–400)
RBC: 4.33 MIL/uL (ref 4.22–5.81)
RDW: 13.4 % (ref 11.5–15.5)
WBC: 4.5 10*3/uL (ref 4.0–10.5)

## 2015-04-18 MED ORDER — SODIUM CHLORIDE 0.9 % IV BOLUS (SEPSIS)
1000.0000 mL | Freq: Once | INTRAVENOUS | Status: AC
  Administered 2015-04-18: 1000 mL via INTRAVENOUS

## 2015-04-18 MED ORDER — OXYCODONE-ACETAMINOPHEN 5-325 MG PO TABS
1.0000 | ORAL_TABLET | Freq: Once | ORAL | Status: DC
Start: 2015-04-18 — End: 2015-04-18

## 2015-04-18 MED ORDER — IBUPROFEN 400 MG PO TABS: 600.0000 mg | ORAL_TABLET | Freq: Once | ORAL | Status: DC

## 2015-04-18 MED ORDER — ONDANSETRON HCL 4 MG/2ML IJ SOLN
4.0000 mg | Freq: Once | INTRAMUSCULAR | Status: AC
  Administered 2015-04-18: 4 mg via INTRAVENOUS
  Filled 2015-04-18: qty 2

## 2015-04-18 NOTE — ED Notes (Signed)
Patient c/o of intermittent N&V times one year with most recent bout beginning yesterday, states he threw up around 3-4 times this weekend. Denies abdominal pain, admits to soreness from comiting.

## 2015-04-18 NOTE — ED Provider Notes (Signed)
CSN: [id number]     Arrival date & time 04/18/15  2231 History  By signing my name below, I, Murriel Hopper, attest that this documentation has been prepared under the direction and in the presence of Devoria Albe, MD at 2310. Electronically Signed: Murriel Hopper, ED Scribe. 04/18/2015. 11:24 PM.    Chief Complaint  Patient presents with  . Nausea     The history is provided by the patient. No language interpreter was used.   HPI Comments: Todd Randolph is a 35 y.o. male who presents to the Emergency Department complaining of intermittent, recurrent nausea and vomiting that has been present since January 12th. . Pt states that he has vomited 3x today, and has vomited intermittently for the past few days. Pt states he has had similar symptoms in the past that date back to around a year in which he vomiting daily for about three months then vomits intermittently for a few days, and then does not vomit for a while.  Pt states he does not have any abdominal pain until after he vomits, in which he states his abdomen feels sore for a brief time. He states today he is feeling weak. Pt states his vomiting has been random, and reports vomiting sometimes after he eats food, and between meals. He has been to the ED 4 times before, but has never seen a gastroenterologist.. Pt denies diarrhea or fever.   PCP None  Past Medical History  Diagnosis Date  . Heart murmur   . Asthma   . Amyloidosis [facility])    Past Surgical History  Procedure Laterality Date  . Adenoidectomy    . Tonsillectomy     History reviewed. No pertinent family history. Social History  Substance Use Topics  . Smoking status: Current Every Day Smoker -- 1.00 packs/day    Types: Cigarettes  . Smokeless tobacco: Never Used  . Alcohol Use: No  employed as a Insurance account manager marijuana daily  Review of Systems  Gastrointestinal: Positive for vomiting. Negative for diarrhea.  All other systems reviewed and are negative.   A complete 10  system review of systems was obtained and all systems are negative except as noted in the HPI and PMH.    Allergies  Review of patient's allergies indicates no known allergies.  Home Medications   Prior to Admission medications   Medication Sig Start Date End Date Taking? Authorizing Provider  HYDROcodone-acetaminophen (NORCO/VICODIN) 5-325 MG per tablet Take 1 tablet by mouth every 4 (four) hours as needed for moderate pain. Patient not taking: Reported on 07/17/2014 09/23/13   Azalia Bilis, MD  ondansetron [facility] ODT) 8 MG disintegrating tablet 8mg  ODT q4 hours prn nausea 09/06/14   Dahlia Client Muthersbaugh, PA-C  ondansetron (ZOFRAN) 4 MG tablet Take 1 tablet (4 mg total) by mouth every 8 (eight) hours as needed for nausea or vomiting. 04/19/15   Devoria Albe, MD  promethazine (PHENERGAN) 25 MG tablet Take 1 tablet (25 mg total) by mouth every 6 (six) hours as needed for nausea or vomiting. 01/01/15   Raeford Razor, MD  sucralfate (CARAFATE) 1 G tablet Take 1 tablet (1 g total) by mouth 4 (four) times daily -  with meals and at bedtime. Patient not taking: Reported on 07/17/2014 07/14/14   Garlon Hatchet, PA-C   BP 118/60 mmHg  Pulse 62  Temp(Src) 99 F (37.2 C) (Temporal)  Resp 18  Ht 5\' 9"  (1.753 m)  Wt 160 lb (72.576 kg)  BMI 23.62 kg/m2  SpO2 99%  Vital signs normal   Physical Exam  Constitutional: He is oriented to person, place, and time. He appears well-developed and well-nourished.  Non-toxic appearance. He does not appear ill. No distress.  HENT:  Head: Normocephalic and atraumatic.  Right Ear: External ear normal.  Left Ear: External ear normal.  Nose: Nose normal. No mucosal edema or rhinorrhea.  Mouth/Throat: Mucous membranes are normal. No dental abscesses or uvula swelling.  Mucous membranes mildly dry  Eyes: Conjunctivae and EOM are normal. Pupils are equal, round, and reactive to light.  Neck: Normal range of motion and full passive range of motion without pain. Neck  supple.  Cardiovascular: Normal rate, regular rhythm and normal heart sounds.  Exam reveals no gallop and no friction rub.   No murmur heard. Pulmonary/Chest: Effort normal and breath sounds normal. No respiratory distress. He has no wheezes. He has no rhonchi. He has no rales. He exhibits no tenderness and no crepitus.  Abdominal: Soft. Normal appearance and bowel sounds are normal. He exhibits no distension. There is no tenderness. There is no rebound and no guarding.  Musculoskeletal: Normal range of motion. He exhibits no edema or tenderness.  Moves all extremities well.   Neurological: He is alert and oriented to person, place, and time. He has normal strength. No cranial nerve deficit.  Skin: Skin is warm, dry and intact. No rash noted. No erythema. No pallor.  Psychiatric: He has a normal mood and affect. His speech is normal and behavior is normal. His mood appears not anxious.  Nursing note and vitals reviewed.   ED Course  Procedures (including critical care time)  Medications  sodium chloride 0.9 % bolus 1,000 mL (0 mLs Intravenous Stopped 04/19/15 0017)  ondansetron (ZOFRAN) injection 4 mg (4 mg Intravenous Given 04/18/15 2337)     DIAGNOSTIC STUDIES: Oxygen Saturation is 99% on room air, normal by my interpretation.    COORDINATION OF CARE: 11:23 PM Discussed treatment plan with pt at bedside and pt agreed to plan. Patient was given IV fluids and IV Zofran for his nausea. We discussed there is a new evidence that chronic marijuana smoking can increase incidents of unexplained vomiting. It would be worth a trial to stop to see if that helps his symptoms.  12:39 AM Pt reports he finished his bolus, drank water, feels fine, and states he is ready to go home.   Labs Review Results for orders placed or performed during the hospital encounter of 04/18/15  Comprehensive metabolic panel  Result Value Ref Range   Sodium 139 135 - 145 mmol/L   Potassium 3.8 3.5 - 5.1 mmol/L    Chloride 104 101 - 111 mmol/L   CO2 28 22 - 32 mmol/L   Glucose, Bld 105 (H) 65 - 99 mg/dL   BUN 14 6 - 20 mg/dL   Creatinine, Ser 1.61 0.61 - 1.24 mg/dL   Calcium 9.0 8.9 - 09.6 mg/dL   Total Protein 6.8 6.5 - 8.1 g/dL   Albumin 3.9 3.5 - 5.0 g/dL   AST 23 15 - 41 U/L   ALT 14 (L) 17 - 63 U/L   Alkaline Phosphatase 39 38 - 126 U/L   Total Bilirubin 0.6 0.3 - 1.2 mg/dL   GFR calc non Af Amer >60 >60 mL/min   GFR calc Af Amer >60 >60 mL/min   Anion gap 7 5 - 15  CBC with Differential  Result Value Ref Range   WBC 4.5 4.0 - 10.5 K/uL   RBC 4.33  4.22 - 5.81 MIL/uL   Hemoglobin 13.0 13.0 - 17.0 g/dL   HCT 16.138.4 (L) 09.639.0 - 04.552.0 %   MCV 88.7 78.0 - 100.0 fL   MCH 30.0 26.0 - 34.0 pg   MCHC 33.9 30.0 - 36.0 g/dL   RDW 40.913.4 81.111.5 - 91.415.5 %   Platelets 167 150 - 400 K/uL   Neutrophils Relative % 58 %   Neutro Abs 2.6 1.7 - 7.7 K/uL   Lymphocytes Relative 37 %   Lymphs Abs 1.7 0.7 - 4.0 K/uL   Monocytes Relative 4 %   Monocytes Absolute 0.2 0.1 - 1.0 K/uL   Eosinophils Relative 1 %   Eosinophils Absolute 0.0 0.0 - 0.7 K/uL   Basophils Relative 0 %   Basophils Absolute 0.0 0.0 - 0.1 K/uL   Laboratory interpretation all normal   MDM   Final diagnoses:  Non-intractable vomiting with nausea, vomiting of unspecified type    New Prescriptions   ONDANSETRON (ZOFRAN) 4 MG TABLET    Take 1 tablet (4 mg total) by mouth every 8 (eight) hours as needed for nausea or vomiting.    Plan discharge  Devoria AlbeIva Shadiamond Koska, MD, FACEP   I personally performed the services described in this documentation, which was scribed in my presence. The recorded information has been reviewed and considered.  Devoria AlbeIva Modesty Rudy, MD, Concha PyoFACEP    Shaunda Tipping, MD 04/19/15 [phone]

## 2015-04-19 LAB — COMPREHENSIVE METABOLIC PANEL
ALK PHOS: 39 U/L (ref 38–126)
ALT: 14 U/L — AB (ref 17–63)
AST: 23 U/L (ref 15–41)
Albumin: 3.9 g/dL (ref 3.5–5.0)
Anion gap: 7 (ref 5–15)
BILIRUBIN TOTAL: 0.6 mg/dL (ref 0.3–1.2)
BUN: 14 mg/dL (ref 6–20)
CALCIUM: 9 mg/dL (ref 8.9–10.3)
CO2: 28 mmol/L (ref 22–32)
Chloride: 104 mmol/L (ref 101–111)
Creatinine, Ser: 1.18 mg/dL (ref 0.61–1.24)
GFR calc Af Amer: >60 mL/min (ref >60)
GFR calc non Af Amer: >60 mL/min (ref >60)
GLUCOSE: 105 mg/dL — AB (ref 65–99)
Potassium: 3.8 mmol/L (ref 3.5–5.1)
SODIUM: 139 mmol/L (ref 135–145)
Total Protein: 6.8 g/dL (ref 6.5–8.1)

## 2015-04-19 MED ORDER — ONDANSETRON HCL 4 MG PO TABS: 4.0000 mg | ORAL_TABLET | Freq: Three times a day (TID) | ORAL | Status: DC | PRN

## 2015-04-19 NOTE — Discharge Instructions (Signed)
Drink plenty of fluids. Use the zofran for nausea or vomiting. Avoid fried, spicy or greasy foods for a few days. Consider stopping smoking weed, this may help stop your nausea and vomiting. If it continues, call Dr Lisbeth RenshawFields's office to get an appointment to have her evaluate you, she is a gastroenterologist (stomach specialist).    Nausea and Vomiting Nausea is a sick feeling that often comes before throwing up (vomiting). Vomiting is a reflex where stomach contents come out of your mouth. Vomiting can cause severe loss of body fluids (dehydration). Children and elderly adults can become dehydrated quickly, especially if they also have diarrhea. Nausea and vomiting are symptoms of a condition or disease. It is important to find the cause of your symptoms. CAUSES   Direct irritation of the stomach lining. This irritation can result from increased acid production (gastroesophageal reflux disease), infection, food poisoning, taking certain medicines (such as nonsteroidal anti-inflammatory drugs), alcohol use, or tobacco use.  Signals from the brain.These signals could be caused by a headache, heat exposure, an inner ear disturbance, increased pressure in the brain from injury, infection, a tumor, or a concussion, pain, emotional stimulus, or metabolic problems.  An obstruction in the gastrointestinal tract (bowel obstruction).  Illnesses such as diabetes, hepatitis, gallbladder problems, appendicitis, kidney problems, cancer, sepsis, atypical symptoms of a heart attack, or eating disorders.  Medical treatments such as chemotherapy and radiation.  Receiving medicine that makes you sleep (general anesthetic) during surgery. DIAGNOSIS Your caregiver may ask for tests to be done if the problems do not improve after a few days. Tests may also be done if symptoms are severe or if the reason for the nausea and vomiting is not clear. Tests may include:  Urine tests.  Blood tests.  Stool  tests.  Cultures (to look for evidence of infection).  X-rays or other imaging studies. Test results can help your caregiver make decisions about treatment or the need for additional tests. TREATMENT You need to stay well hydrated. Drink frequently but in small amounts.You may wish to drink water, sports drinks, clear broth, or eat frozen ice pops or gelatin dessert to help stay hydrated.When you eat, eating slowly may help prevent nausea.There are also some antinausea medicines that may help prevent nausea. HOME CARE INSTRUCTIONS   Take all medicine as directed by your caregiver.  If you do not have an appetite, do not force yourself to eat. However, you must continue to drink fluids.  If you have an appetite, eat a normal diet unless your caregiver tells you differently.  Eat a variety of complex carbohydrates (rice, wheat, potatoes, bread), lean meats, yogurt, fruits, and vegetables.  Avoid high-fat foods because they are more difficult to digest.  Drink enough water and fluids to keep your urine clear or pale yellow.  If you are dehydrated, ask your caregiver for specific rehydration instructions. Signs of dehydration may include:  Severe thirst.  Dry lips and mouth.  Dizziness.  Dark urine.  Decreasing urine frequency and amount.  Confusion.  Rapid breathing or pulse. SEEK IMMEDIATE MEDICAL CARE IF:   You have blood or brown flecks (like coffee grounds) in your vomit.  You have black or bloody stools.  You have a severe headache or stiff neck.  You are confused.  You have severe abdominal pain.  You have chest pain or trouble breathing.  You do not urinate at least once every 8 hours.  You develop cold or clammy skin.  You continue to vomit for longer  than 24 to 48 hours.  You have a fever. MAKE SURE YOU:   Understand these instructions.  Will watch your condition.  Will get help right away if you are not doing well or get worse.   This  information is not intended to replace advice given to you by your health care provider. Make sure you discuss any questions you have with your health care provider.   Document Released: 03/20/2005 Document Revised: 06/12/2011 Document Reviewed: 08/17/2010 Elsevier Interactive Patient Education [employer]2016 [employer] Inc.

## 2015-08-10 ENCOUNTER — Emergency Department (HOSPITAL_COMMUNITY)
Admission: EM | Admit: 2015-08-10 | Discharge: 2015-08-10 | Disposition: A | Discharge status: Home / Self Care | Payer: Self-pay | Attending: Emergency Medicine | Admitting: Emergency Medicine

## 2015-08-10 ENCOUNTER — Encounter (HOSPITAL_COMMUNITY): Payer: Self-pay | Admitting: Emergency Medicine

## 2015-08-10 DIAGNOSIS — J45909 Unspecified asthma, uncomplicated: Secondary | ICD-10-CM | POA: Insufficient documentation

## 2015-08-10 DIAGNOSIS — F17210 Nicotine dependence, cigarettes, uncomplicated: Secondary | ICD-10-CM | POA: Insufficient documentation

## 2015-08-10 DIAGNOSIS — H53149 Visual discomfort, unspecified: Secondary | ICD-10-CM | POA: Insufficient documentation

## 2015-08-10 DIAGNOSIS — K0889 Other specified disorders of teeth and supporting structures: Secondary | ICD-10-CM | POA: Insufficient documentation

## 2015-08-10 DIAGNOSIS — R51 Headache: Secondary | ICD-10-CM | POA: Insufficient documentation

## 2015-08-10 DIAGNOSIS — R519 Headache, unspecified: Secondary | ICD-10-CM

## 2015-08-10 MED ORDER — KETOROLAC TROMETHAMINE 60 MG/2ML IM SOLN
60.0000 mg | Freq: Once | INTRAMUSCULAR | Status: AC
  Administered 2015-08-10: 60 mg via INTRAMUSCULAR
  Filled 2015-08-10: qty 2

## 2015-08-10 MED ORDER — PROCHLORPERAZINE EDISYLATE 5 MG/ML IJ SOLN
10.0000 mg | Freq: Once | INTRAMUSCULAR | Status: AC
  Administered 2015-08-10: 10 mg via INTRAMUSCULAR
  Filled 2015-08-10: qty 2

## 2015-08-10 MED ORDER — IBUPROFEN 800 MG PO TABS: 800.0000 mg | ORAL_TABLET | Freq: Three times a day (TID) | ORAL | Status: DC

## 2015-08-10 NOTE — ED Provider Notes (Signed)
CSN: [id number]     Arrival date & time 08/10/15  2102 History   First MD Initiated Contact with Patient 08/10/15 2120     Chief Complaint  Patient presents with  . 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 The patient states that over the last month he has had several headaches, they last between one and 2 hours, they come on every couple of weeks, they're associated with some dental pain, he does get photophobic and phonophobic with this. He reports that today he was having his normal day, he had no symptoms until he came home from work and had acute onset of bilateral bitemporal headaches that are above and behind the eyes. He gets acutely photophobic, he has had approximately 6 months of intermittent nausea and vomiting which is gradually taper off and improving. He states he's had a normal appetite and denies all other review of system problems including numbness, weakness, ataxia, difficulty with hearing, speech, vision or coordination.  Patient is a 35 y.o. male presenting with headaches. The history is provided by the patient.  Headache   Past Medical History  Diagnosis Date  . Heart murmur   . Asthma   . Amyloidosis [facility])    Past Surgical History  Procedure Laterality Date  . Adenoidectomy    . Tonsillectomy     History reviewed. No pertinent family history. Social History  Substance Use Topics  . Smoking status: Current Every Day Smoker -- 1.00 packs/day    Types: Cigarettes  . Smokeless tobacco: Never Used  . Alcohol Use: No    Review of Systems  Neurological: Positive for headaches.  All other systems reviewed and are negative.     Allergies  Review of patient's allergies indicates no known allergies.  Home Medications   Prior to Admission medications    Medication Sig Start Date End Date Taking? Authorizing Provider  HYDROcodone-acetaminophen (NORCO/VICODIN) 5-325 MG per tablet Take 1 tablet by mouth every 4 (four) hours as needed for moderate pain. Patient not taking: Reported on 07/17/2014 09/23/13   Azalia Bilis, MD  ibuprofen (ADVIL,MOTRIN) 800 MG tablet Take 1 tablet (800 mg total) by mouth 3 (three) times daily. 08/10/15   Eber Hong, MD  ondansetron (ZOFRAN ODT) 8 MG disintegrating tablet  ODT q4 hours prn nausea 09/06/14   Dahlia Client Muthersbaugh, PA-C  ondansetron (ZOFRAN) 4 MG tablet Take 1 tablet (4 mg total) by mouth every 8 (eight) hours as needed for nausea or vomiting. 04/19/15   Devoria Albe, MD  promethazine (PHENERGAN) 25 MG tablet Take 1 tablet (25 mg total) by mouth every 6 (six) hours as needed for nausea or vomiting. 01/01/15   Raeford Razor, MD  sucralfate (CARAFATE) 1 G tablet Take 1 tablet (1 g total) by mouth 4 (four) times daily -  with meals and at bedtime. Patient not taking: Reported on 07/17/2014 07/14/14   Garlon Hatchet, PA-C   BP 135/88 mmHg  Pulse 60  Temp(Src) 98.3 F (36.8 C) (Oral)  Resp 18  Ht  (1.854 m)  Wt 190 lb (86.183 kg)  BMI 25.07 kg/m2  SpO2 99% Physical Exam  Constitutional: He appears well-developed and well-nourished. No distress.  HENT:  Head: Normocephalic and atraumatic.  Mouth/Throat: Oropharynx is clear and moist. No oropharyngeal exudate.   clear clear oropharynx, moist mucous membranes, no uvular deviation, no  tonsillar hypertrophy, no exudate asymmetry or erythema. Dentition is generally overall well-appearing though he does have some caries and one deep carry going down to the gumline. All 4 wisdom teeth are impacted, he has no trismus or torticollis, no signs of periodontal significant disease were periapical abscesses   Eyes: Conjunctivae and EOM are normal. Pupils are equal, round, and reactive to light. Right eye exhibits no discharge. Left eye exhibits no discharge. No scleral  icterus.  Neck: Normal range of motion. Neck supple. No JVD present. No thyromegaly present.  Supple neck without lymphadenopathy or thyromegaly  Cardiovascular: Normal rate, regular rhythm, normal heart sounds and intact distal pulses.  Exam reveals no gallop and no friction rub.   No murmur heard. Pulmonary/Chest: Effort normal and breath sounds normal. No respiratory distress. He has no wheezes. He has no rales.  Abdominal: Soft. Bowel sounds are normal. He exhibits no distension and no mass. There is no tenderness.  Musculoskeletal: Normal range of motion. He exhibits no edema or tenderness.  Lymphadenopathy:    He has no cervical adenopathy.  Neurological: He is alert. Coordination normal.  Speech is clear, cranial nerves III through XII are intact, memory is intact, strength is normal in all 4 extremities including grips, sensation is intact to light touch and pinprick in all 4 extremities. Coordination as tested by finger-nose-finger is normal, no limb ataxia. Normal gait, normal reflexes at the patellar tendons bilaterally  Skin: Skin is warm and dry. No rash noted. No erythema.  No rashes  Psychiatric: He has a normal mood and affect. His behavior is normal.  Nursing note and vitals reviewed.   ED Course  Procedures (including critical care time) Labs Review Labs Reviewed - No data to display  Imaging Review No results found. I have personally reviewed and evaluated these images and lab results as part of my medical decision-making.    MDM   Final diagnoses:  Nonintractable headache, unspecified chronicity pattern, unspecified headache type    Overall the patient is well-appearing, vital signs are fairly unremarkable other than very mild hypertension, though his dentition does appear to have some small problems there does not appear to be any large abscesses or anything that would be causing his intermittent headaches. He does not have any focal neurologic findings or  chronic neurologic complaints to suggest another source such as intracranial mass, tumor, hemorrhage or aneurysm. The patient will get intramuscular medications, I will start him on Imitrex and anti-inflammatories, he will need to follow-up closely if his headaches continue. I do not think that neuro imaging is warranted at this time.  Pain 2/10 after meds - expressed understanding for d/c and return precautions - requesting d/c - appears stable and improved   Meds given in ED:  Medications  ketorolac (TORADOL) injection 60 mg (60 mg Intramuscular Given 08/10/15 2157)  prochlorperazine (COMPAZINE) injection 10 mg (10 mg Intramuscular Given 08/10/15 2202)    New Prescriptions   IBUPROFEN (ADVIL,MOTRIN) 800 MG TABLET    Take 1 tablet (800 mg total) by mouth 3 (three) times daily.      Eber HongBrian Sabastien Tyler, MD 08/10/15 2234

## 2015-08-10 NOTE — ED Notes (Signed)
Patient given discharge instruction, verbalized understand. Patient ambulatory out of the department.  

## 2015-08-10 NOTE — Discharge Instructions (Signed)
Please obtain all of your results from medical records or have your doctors office obtain the results - share them with your doctor - you should be seen at your doctors office in the next 2 days. Call today to arrange your follow up. Take the medications as prescribed. Please review all of the medicines and only take them if you do not have an allergy to them. Please be aware that if you are taking birth control pills, taking other prescriptions, ESPECIALLY ANTIBIOTICS may make the birth control ineffective - if this is the case, either do not engage in sexual activity or use alternative methods of birth control such as condoms until you have finished the medicine and your family doctor says it is OK to restart them. If you are on a blood thinner such as COUMADIN, be aware that any other medicine that you take may cause the coumadin to either work too much, or not enough - you should have your coumadin level rechecked in next 7 days if this is the case.  °?  °It is also a possibility that you have an allergic reaction to any of the medicines that you have been prescribed - Everybody reacts differently to medications and while MOST people have no trouble with most medicines, you may have a reaction such as nausea, vomiting, rash, swelling, shortness of breath. If this is the case, please stop taking the medicine immediately and contact your physician.  °?  °You should return to the ER if you develop severe or worsening symptoms.  ° °[facility] Doctor List ° ° ° °Edward Hawkins MD. Specialty: Pulmonary Disease Contact information: [address]  °[address]  °[place] [address]  °[phone]  ° °Margaret Simpson, MD. Specialty: Family Medicine Contact information: [address]  °[place] [address]  °[phone]  ° °Scott Luking, MD. Specialty: Family Medicine Contact information: [address]  °[address]  °[place] [address]  °[phone]  ° °Tesfaye Fanta, MD Specialty:  Internal Medicine Contact information: [address]  °[place] [address]  °[phone]  ° °Zach Hall, MD. Specialty: Internal Medicine Contact information: [address]  °[place] [address]  °[phone]  ° °Angus Mcinnis, MD. Specialty: Family Medicine Contact information: [address]  °[place] [address]  °[phone]  ° °Stephen Knowlton, MD. Specialty: Family Medicine Contact information: [address]  °[address]  °[place] [address]  °[phone]  ° °Roy Fagan, MD. Specialty: Internal Medicine Contact information: [address]  °[address]  °[place]  [address]  °[phone]  ° °

## 2015-08-10 NOTE — ED Notes (Signed)
Patient complaining of headache off and on x 1 month. States "I think it has something to do with my tooth on the lower right it's been hurting really bad but now the pain is in my head instead of my tooth." Patient states he is unable to open his eyes due to the pain.

## 2015-10-25 ENCOUNTER — Encounter (HOSPITAL_COMMUNITY): Payer: Self-pay | Admitting: Emergency Medicine

## 2015-10-25 ENCOUNTER — Emergency Department (HOSPITAL_COMMUNITY)
Admission: EM | Admit: 2015-10-25 | Discharge: 2015-10-25 | Disposition: A | Discharge status: Home / Self Care | Payer: Self-pay | Attending: Emergency Medicine | Admitting: Emergency Medicine

## 2015-10-25 DIAGNOSIS — J45909 Unspecified asthma, uncomplicated: Secondary | ICD-10-CM | POA: Insufficient documentation

## 2015-10-25 DIAGNOSIS — K047 Periapical abscess without sinus: Secondary | ICD-10-CM | POA: Insufficient documentation

## 2015-10-25 DIAGNOSIS — F17210 Nicotine dependence, cigarettes, uncomplicated: Secondary | ICD-10-CM | POA: Insufficient documentation

## 2015-10-25 MED ORDER — HYDROCODONE-ACETAMINOPHEN 5-325 MG PO TABS
1.0000 | ORAL_TABLET | Freq: Once | ORAL | Status: AC
  Administered 2015-10-25: 1 via ORAL
  Filled 2015-10-25: qty 1

## 2015-10-25 MED ORDER — CLINDAMYCIN HCL 150 MG PO CAPS: 300.0000 mg | ORAL_CAPSULE | Freq: Four times a day (QID) | ORAL | 0 refills | Status: DC

## 2015-10-25 MED ORDER — HYDROCODONE-ACETAMINOPHEN 5-325 MG PO TABS: ORAL_TABLET | ORAL | 0 refills | Status: DC

## 2015-10-25 MED ORDER — IBUPROFEN 800 MG PO TABS: 800.0000 mg | ORAL_TABLET | Freq: Three times a day (TID) | ORAL | 0 refills | Status: DC

## 2015-10-25 MED ORDER — CLINDAMYCIN HCL 150 MG PO CAPS
300.0000 mg | ORAL_CAPSULE | Freq: Once | ORAL | Status: AC
  Administered 2015-10-25: 300 mg via ORAL
  Filled 2015-10-25: qty 2

## 2015-10-25 NOTE — ED Provider Notes (Signed)
[facility]EM[facility] DEPT Provider Note   CSN: [id number] Arrival date & time: 10/25/15  7829  First Provider Contact:  First MD Initiated Contact with Patient 10/25/15 1017        History   Chief Complaint Chief Complaint  Patient presents with  . Dental Pain    HPI Todd Randolph is a 35 y.o. male complaining of right lower dental pain and right facial swelling. He reports pain to his tooth for one month, but noticed increased pain and facial swelling for 3 days.  He has tried tylenol and ibuprofen without relief.  He denies neck pain, fever, chills, difficulty swallowing or breathing.     HPI  Past Medical History:  Diagnosis Date  . Amyloidosis (HCC)   . Asthma   . Heart murmur     There are no active problems to display for this patient.   Past Surgical History:  Procedure Laterality Date  . ADENOIDECTOMY    . TONSILLECTOMY         Home Medications    Prior to Admission medications   Medication Sig Start Date End Date Taking? Authorizing Provider  HYDROcodone-acetaminophen (NORCO/VICODIN) 5-325 MG per tablet Take 1 tablet by mouth every 4 (four) hours as needed for moderate pain. Patient not taking: Reported on 07/17/2014 09/23/13   Azalia Bilis, MD  ibuprofen (ADVIL,MOTRIN) 800 MG tablet Take 1 tablet (800 mg total) by mouth 3 (three) times daily. 08/10/15   Eber Hong, MD  ondansetron (ZOFRAN ODT) 8 MG disintegrating tablet  ODT q4 hours prn nausea 09/06/14   Dahlia Client Muthersbaugh, PA-C  ondansetron (ZOFRAN) 4 MG tablet Take 1 tablet (4 mg total) by mouth every 8 (eight) hours as needed for nausea or vomiting. 04/19/15   Devoria Albe, MD  promethazine (PHENERGAN) 25 MG tablet Take 1 tablet (25 mg total) by mouth every 6 (six) hours as needed for nausea or vomiting. 01/01/15   Raeford Razor, MD  sucralfate (CARAFATE) 1 G tablet Take 1 tablet (1 g total) by mouth 4 (four) times daily -  with meals and at bedtime. Patient not taking: Reported on 07/17/2014 07/14/14   Garlon Hatchet, PA-C    Family History History reviewed. No pertinent family history.  Social History Social History  Substance Use Topics  . Smoking status: Current Every Day Smoker    Packs/day: 1.00    Types: Cigarettes  . Smokeless tobacco: Never Used  . Alcohol use No     Allergies   Review of patient's allergies indicates no known allergies.   Review of Systems Review of Systems  Constitutional: Negative for appetite change and fever.  HENT: Positive for dental problem and facial swelling. Negative for congestion, sore throat and trouble swallowing.   Eyes: Negative for pain and visual disturbance.  Gastrointestinal: Negative for vomiting.  Musculoskeletal: Negative for neck pain and neck stiffness.  Neurological: Negative for dizziness, facial asymmetry and headaches.  Hematological: Negative for adenopathy.  All other systems reviewed and are negative.    Physical Exam Updated Vital Signs BP 128/84 (BP Location: Left Arm)   Pulse 61   Temp 99.5 F (37.5 C) (Oral)   Resp 18   Ht 6' (1.829 m)   Wt 86.2 kg   SpO2 100%   BMI 25.77 kg/m   Physical Exam  Constitutional: He is oriented to person, place, and time. He appears well-developed and well-nourished. No distress.  HENT:  Head: Normocephalic and atraumatic.  Right Ear: Tympanic membrane and ear canal normal.  Left Ear: Tympanic membrane and ear canal normal.  Mouth/Throat: Uvula is midline, oropharynx is clear and moist and mucous membranes are normal. No trismus in the jaw. Dental caries present. No dental abscesses or uvula swelling.  Tenderness and dental caries of the right lower first molar.  Mild to moderate right lower facial swelling, no erythema or fluctuance of the gingiva.  No trismus, or sublingual abnml.    Neck: Normal range of motion. Neck supple.  Cardiovascular: Normal rate, regular rhythm and normal heart sounds.   No murmur heard. Pulmonary/Chest: Effort normal and breath sounds normal.    Musculoskeletal: Normal range of motion.  Lymphadenopathy:    He has no cervical adenopathy.  Neurological: He is alert and oriented to person, place, and time. He exhibits normal muscle tone. Coordination normal.  Skin: Skin is warm and dry.  Nursing note and vitals reviewed.    ED Treatments / Results  Labs (all labs ordered are listed, but only abnormal results are displayed) Labs Reviewed - No data to display  EKG  EKG Interpretation None       Radiology No results found.  Procedures Procedures (including critical care time)  Medications Ordered in ED Medications  clindamycin (CLEOCIN) capsule 300 mg (not administered)  HYDROcodone-acetaminophen (NORCO/VICODIN) 5-325 MG per tablet 1 tablet (not administered)     Initial Impression / Assessment and Plan / ED Course  I have reviewed the triage vital signs and the nursing notes.  Pertinent labs & imaging results that were available during my care of the patient were reviewed by me and considered in my medical decision making (see chart for details).  Clinical Course    Pt non-toxic appearing, airway patent.  No concerning sx's for Ludwig's angina.  Right sided facial swelling without drainable abscess. Pt agrees to arrange dental f/u  Final Clinical Impressions(s) / ED Diagnoses   Final diagnoses:  Dental abscess    New Prescriptions Discharge Medication List as of 10/25/2015 10:32 AM    START taking these medications   Details  clindamycin (CLEOCIN) 150 MG capsule Take 2 capsules (300 mg total) by mouth 4 (four) times daily. For 7 days, Starting Mon 10/25/2015, Print         Shireen Rayburn [place], [place] 10/26/15 1836    Bethann Berkshire, MD 10/27/15 [phone]

## 2015-10-25 NOTE — Discharge Instructions (Signed)
Be sure to follow-up with a dentist soon

## 2015-10-25 NOTE — ED Triage Notes (Signed)
Pt reports right sided dental pain. Pt denies any known injury. Pt reports pain x1 month but reports increase in pain since this weekend. Mild swelling noted to right side of face. Airway patent. nad noted.

## 2016-08-20 ENCOUNTER — Emergency Department (HOSPITAL_COMMUNITY)
Admission: EM | Admit: 2016-08-20 | Discharge: 2016-08-20 | Disposition: A | Discharge status: Home / Self Care | Payer: Self-pay | Attending: Emergency Medicine | Admitting: Emergency Medicine

## 2016-08-20 ENCOUNTER — Encounter (HOSPITAL_COMMUNITY): Payer: Self-pay | Admitting: *Deleted

## 2016-08-20 DIAGNOSIS — J45909 Unspecified asthma, uncomplicated: Secondary | ICD-10-CM | POA: Insufficient documentation

## 2016-08-20 DIAGNOSIS — F17210 Nicotine dependence, cigarettes, uncomplicated: Secondary | ICD-10-CM | POA: Insufficient documentation

## 2016-08-20 DIAGNOSIS — K2900 Acute gastritis without bleeding: Secondary | ICD-10-CM | POA: Insufficient documentation

## 2016-08-20 DIAGNOSIS — R1115 Cyclical vomiting syndrome unrelated to migraine: Secondary | ICD-10-CM

## 2016-08-20 LAB — COMPREHENSIVE METABOLIC PANEL
ALT: 17 U/L (ref 17–63)
ANION GAP: 7 (ref 5–15)
AST: 28 U/L (ref 15–41)
Albumin: 4.3 g/dL (ref 3.5–5.0)
Alkaline Phosphatase: 43 U/L (ref 38–126)
BUN: 10 mg/dL (ref 6–20)
CHLORIDE: 105 mmol/L (ref 101–111)
CO2: 27 mmol/L (ref 22–32)
Calcium: 9.3 mg/dL (ref 8.9–10.3)
Creatinine, Ser: 1.13 mg/dL (ref 0.61–1.24)
GFR calc Af Amer: >60 mL/min (ref >60)
Glucose, Bld: 88 mg/dL (ref 65–99)
POTASSIUM: 3.8 mmol/L (ref 3.5–5.1)
SODIUM: 139 mmol/L (ref 135–145)
Total Bilirubin: 0.5 mg/dL (ref 0.3–1.2)
Total Protein: 7.3 g/dL (ref 6.5–8.1)

## 2016-08-20 LAB — CBC
HEMATOCRIT: 41.1 % (ref 39.0–52.0)
HEMOGLOBIN: 14 g/dL (ref 13.0–17.0)
MCH: 30.5 pg (ref 26.0–34.0)
MCHC: 34.1 g/dL (ref 30.0–36.0)
MCV: 89.5 fL (ref 78.0–100.0)
Platelets: 167 10*3/uL (ref 150–400)
RBC: 4.59 MIL/uL (ref 4.22–5.81)
RDW: 13.6 % (ref 11.5–15.5)
WBC: 6.1 10*3/uL (ref 4.0–10.5)

## 2016-08-20 LAB — URINALYSIS, ROUTINE W REFLEX MICROSCOPIC
Bilirubin Urine: NEGATIVE
GLUCOSE, UA: NEGATIVE mg/dL
Hgb urine dipstick: NEGATIVE
Ketones, ur: NEGATIVE mg/dL
LEUKOCYTES UA: NEGATIVE
Nitrite: NEGATIVE
PH: 6 (ref 5.0–8.0)
Protein, ur: NEGATIVE mg/dL
SPECIFIC GRAVITY, URINE: 1.011 (ref 1.005–1.030)

## 2016-08-20 LAB — LIPASE, BLOOD: LIPASE: 32 U/L (ref 11–51)

## 2016-08-20 MED ORDER — GI COCKTAIL ~~LOC~~
30.0000 mL | Freq: Once | ORAL | Status: AC
  Administered 2016-08-20: 30 mL via ORAL
  Filled 2016-08-20: qty 30

## 2016-08-20 MED ORDER — SODIUM CHLORIDE 0.9 % IV BOLUS (SEPSIS)
1000.0000 mL | Freq: Once | INTRAVENOUS | Status: AC
  Administered 2016-08-20: 1000 mL via INTRAVENOUS

## 2016-08-20 MED ORDER — ONDANSETRON HCL 4 MG/2ML IJ SOLN
4.0000 mg | Freq: Once | INTRAMUSCULAR | Status: AC
  Administered 2016-08-20: 4 mg via INTRAVENOUS
  Filled 2016-08-20: qty 2

## 2016-08-20 MED ORDER — PANTOPRAZOLE SODIUM 20 MG PO TBEC: 20.0000 mg | DELAYED_RELEASE_TABLET | Freq: Two times a day (BID) | ORAL | 0 refills | Status: DC

## 2016-08-20 MED ORDER — SUCRALFATE 1 G PO TABS: 1.0000 g | ORAL_TABLET | Freq: Three times a day (TID) | ORAL | 0 refills | Status: DC | PRN

## 2016-08-20 MED ORDER — ONDANSETRON HCL 4 MG PO TABS: 4.0000 mg | ORAL_TABLET | Freq: Four times a day (QID) | ORAL | 0 refills | Status: DC | PRN

## 2016-08-20 MED ORDER — PANTOPRAZOLE SODIUM 40 MG IV SOLR
40.0000 mg | Freq: Once | INTRAVENOUS | Status: AC
  Administered 2016-08-20: 40 mg via INTRAVENOUS
  Filled 2016-08-20: qty 40

## 2016-08-20 NOTE — Discharge Instructions (Signed)
Stop using ibuprofen and all NSAIDs. Make appointment to follow-up with the gastroenterologist

## 2016-08-20 NOTE — ED Triage Notes (Signed)
Pt reports having n/v all week that has gotten worse today. Pt states he woke up vomiting and has vomited 3 times today. Pt states after he eats, he vomits. Pt reports stomach cramping and burning.

## 2016-08-20 NOTE — ED Provider Notes (Signed)
[facility] Provider Note   CSN: [id number] Arrival date & time: 08/20/16  1845   By signing my name below, I, Clarisse GougeXavier Herndon, attest that this documentation has been prepared under the direction and in the presence of Loren RacerYelverton, Newt Levingston, MD. Electronically signed, Clarisse GougeXavier Herndon, ED Scribe. 08/21/16. 10:13 PM.   History   Chief Complaint Chief Complaint  Patient presents with  . Abdominal Pain   The history is provided by the patient and medical records. No language interpreter was used.    Todd Randolph is a 36 y.o. male who presents to the Emergency Department with concern for upper abdominal pain and vomiting worsening of the last week. Patient states he's had some loose stool but denies melena or gross blood. Patient has been taking Ibuprofen 800 mg for the pain. States that eating and drinking worsens the pain. Denies chest pain or shortness of breath. No fever or chills.  Past Medical History:  Diagnosis Date  . Amyloidosis (HCC)   . Asthma   . Heart murmur     There are no active problems to display for this patient.   Past Surgical History:  Procedure Laterality Date  . ADENOIDECTOMY    . TONSILLECTOMY         Home Medications    Prior to Admission medications   Medication Sig Start Date End Date Taking? Authorizing Provider  ondansetron (ZOFRAN) 4 MG tablet Take 1 tablet (4 mg total) by mouth every 6 (six) hours as needed for nausea or vomiting. 08/20/16   Loren RacerYelverton, Yeng Perz, MD  pantoprazole (PROTONIX) 20 MG tablet Take 1 tablet (20 mg total) by mouth 2 (two) times daily. 08/20/16   Loren RacerYelverton, Kynsleigh Westendorf, MD  sucralfate (CARAFATE) 1 g tablet Take 1 tablet (1 g total) by mouth 3 (three) times daily as needed. 08/20/16   Loren RacerYelverton, Tifanny Dollens, MD    Family History History reviewed. No pertinent family history.  Social History Social History  Substance Use Topics  . Smoking status: Current Every Day Smoker    Packs/day: 0.50    Types: Cigarettes  . Smokeless  tobacco: Never Used  . Alcohol use No     Allergies   Patient has no known allergies.   Review of Systems Review of Systems  Constitutional: Negative for chills and fever.  Respiratory: Negative for cough and shortness of breath.   Cardiovascular: Negative for chest pain, palpitations and leg swelling.  Gastrointestinal: Positive for abdominal pain, diarrhea, nausea and vomiting. Negative for anal bleeding, blood in stool and constipation.  Genitourinary: Negative for dysuria, flank pain and hematuria.  Musculoskeletal: Negative for back pain and myalgias.  Skin: Negative for rash and wound.  Neurological: Negative for dizziness, weakness, light-headedness, numbness and headaches.  All other systems reviewed and are negative.    Physical Exam Updated Vital Signs BP 119/85   Pulse 92   Temp 98.3 F (36.8 C) (Oral)   Resp 16   Ht 6\' 1"  (1.854 m)   Wt 190 lb (86.2 kg)   SpO2 100%   BMI 25.07 kg/m   Physical Exam  Constitutional: He is oriented to person, place, and time. He appears well-developed and well-nourished. No distress.  HENT:  Head: Normocephalic and atraumatic.  Mouth/Throat: Oropharynx is clear and moist. No oropharyngeal exudate.  Eyes: EOM are normal. Pupils are equal, round, and reactive to light.  Neck: Normal range of motion. Neck supple.  Cardiovascular: Normal rate and regular rhythm.  Exam reveals no gallop and no friction rub.  No murmur heard. Pulmonary/Chest: Effort normal and breath sounds normal. No respiratory distress. He has no wheezes. He has no rales. He exhibits no tenderness.  Abdominal: Soft. Bowel sounds are normal. There is tenderness (mild epigastric tenderness to palpation.). There is no rebound and no guarding. No hernia.  Musculoskeletal: Normal range of motion. He exhibits no edema or tenderness.  No CVA tenderness bilaterally.  Neurological: He is alert and oriented to person, place, and time.  Skin: Skin is warm and dry.  Capillary refill takes less than 2 seconds. No rash noted. No erythema.  Psychiatric: He has a normal mood and affect. His behavior is normal.  Nursing note and vitals reviewed.    ED Treatments / Results  DIAGNOSTIC STUDIES: Oxygen Saturation is 100% on RA, NL by my interpretation.    COORDINATION OF CARE: 10:10 PM-Discussed next steps with pt. Pt verbalized understanding and is agreeable with the plan. Will order fluids, labs and medications and then reassess.   Labs (all labs ordered are listed, but only abnormal results are displayed) Labs Reviewed  LIPASE, BLOOD  COMPREHENSIVE METABOLIC PANEL  CBC  URINALYSIS, ROUTINE W REFLEX MICROSCOPIC    EKG  EKG Interpretation None       Radiology No results found.  Procedures Procedures (including critical care time)  Medications Ordered in ED Medications  sodium chloride 0.9 % bolus 1,000 mL (0 mLs Intravenous Stopped 08/20/16 2321)  pantoprazole (PROTONIX) injection 40 mg (40 mg Intravenous Given 08/20/16 2218)  gi cocktail (Maalox,Lidocaine,Donnatal) (30 mLs Oral Given 08/20/16 2218)  ondansetron (ZOFRAN) injection 4 mg (4 mg Intravenous Given 08/20/16 2218)     Initial Impression / Assessment and Plan / ED Course  I have reviewed the triage vital signs and the nursing notes.  Pertinent labs & imaging results that were available during my care of the patient were reviewed by me and considered in my medical decision making (see chart for details).     Patient states he is feeling much better after medication. Will place on PPI and Carafate. Likely has NSAID-induced gastritis. Advised to avoid ibuprofen and all NSAIDs. Given referral to gastroenterology. Return precautions given.  Final Clinical Impressions(s) / ED Diagnoses   Final diagnoses:  Acute gastritis, presence of bleeding unspecified, unspecified gastritis type  Non-intractable cyclical vomiting with nausea    New Prescriptions Discharge Medication List  as of 08/20/2016 11:41 PM    START taking these medications   Details  ondansetron (ZOFRAN) 4 MG tablet Take 1 tablet (4 mg total) by mouth every 6 (six) hours as needed for nausea or vomiting., Starting Sun 08/20/2016, Print    pantoprazole (PROTONIX) 20 MG tablet Take 1 tablet (20 mg total) by mouth 2 (two) times daily., Starting Sun 08/20/2016, Print    sucralfate (CARAFATE) 1 g tablet Take 1 tablet (1 g total) by mouth 3 (three) times daily as needed., Starting Sun 08/20/2016, Print      I personally performed the services described in this documentation, which was scribed in my presence. The recorded information has been reviewed and is accurate.      Loren Racer, MD 08/21/16 [phone]

## 2017-10-29 ENCOUNTER — Encounter (HOSPITAL_COMMUNITY): Payer: Self-pay

## 2017-10-29 ENCOUNTER — Emergency Department (HOSPITAL_COMMUNITY)
Admission: EM | Admit: 2017-10-29 | Discharge: 2017-10-29 | Disposition: A | Discharge status: Home / Self Care | Payer: Self-pay | Attending: Emergency Medicine | Admitting: Emergency Medicine

## 2017-10-29 DIAGNOSIS — Z5321 Procedure and treatment not carried out due to patient leaving prior to being seen by health care provider: Secondary | ICD-10-CM | POA: Insufficient documentation

## 2017-10-29 DIAGNOSIS — H5713 Ocular pain, bilateral: Secondary | ICD-10-CM | POA: Insufficient documentation

## 2017-10-29 HISTORY — DX: Headache, unspecified: R51.9

## 2017-10-29 HISTORY — DX: Headache: R51

## 2017-10-29 HISTORY — DX: Other chronic pain: G89.29

## 2017-10-29 NOTE — ED Triage Notes (Addendum)
Pt reports bilateral eye pain for 2 weeks. Pain has been intermittent. Reports it feels like pressure behind his eyes. Occassional watering of eyes. Pt reports that earlier today the pain was so strong in his eyes that it was pounding in his left arm and chest.

## 2017-10-29 NOTE — ED Notes (Signed)
No answer in waiting room X3,  

## 2017-10-29 NOTE — ED Notes (Signed)
No answer in waiting in room X1,

## 2017-10-29 NOTE — ED Notes (Signed)
No answer in waiting room X2,  

## 2017-10-30 NOTE — ED Notes (Signed)
Follow up call made  Unable to talk to pt  10/30/17   1115  s Reghan Thul rn

## 2018-10-16 ENCOUNTER — Encounter (HOSPITAL_COMMUNITY): Payer: Self-pay

## 2018-10-16 ENCOUNTER — Emergency Department (HOSPITAL_COMMUNITY)
Admission: EM | Admit: 2018-10-16 | Discharge: 2018-10-16 | Disposition: A | Discharge status: Home / Self Care | Payer: Self-pay | Attending: Emergency Medicine | Admitting: Emergency Medicine

## 2018-10-16 ENCOUNTER — Emergency Department (HOSPITAL_COMMUNITY): Payer: Self-pay

## 2018-10-16 ENCOUNTER — Other Ambulatory Visit: Payer: Self-pay

## 2018-10-16 DIAGNOSIS — Z79899 Other long term (current) drug therapy: Secondary | ICD-10-CM | POA: Insufficient documentation

## 2018-10-16 DIAGNOSIS — F17210 Nicotine dependence, cigarettes, uncomplicated: Secondary | ICD-10-CM | POA: Insufficient documentation

## 2018-10-16 DIAGNOSIS — Y929 Unspecified place or not applicable: Secondary | ICD-10-CM | POA: Insufficient documentation

## 2018-10-16 DIAGNOSIS — X500XXA Overexertion from strenuous movement or load, initial encounter: Secondary | ICD-10-CM | POA: Insufficient documentation

## 2018-10-16 DIAGNOSIS — S46911A Strain of unspecified muscle, fascia and tendon at shoulder and upper arm level, right arm, initial encounter: Secondary | ICD-10-CM | POA: Insufficient documentation

## 2018-10-16 DIAGNOSIS — J45909 Unspecified asthma, uncomplicated: Secondary | ICD-10-CM | POA: Insufficient documentation

## 2018-10-16 DIAGNOSIS — Y93G1 Activity, food preparation and clean up: Secondary | ICD-10-CM | POA: Insufficient documentation

## 2018-10-16 DIAGNOSIS — Y999 Unspecified external cause status: Secondary | ICD-10-CM | POA: Insufficient documentation

## 2018-10-16 MED ORDER — CYCLOBENZAPRINE HCL 5 MG PO TABS: 5.0000 mg | ORAL_TABLET | Freq: Three times a day (TID) | ORAL | 0 refills | Status: DC | PRN

## 2018-10-16 MED ORDER — IBUPROFEN 800 MG PO TABS: 800.0000 mg | ORAL_TABLET | Freq: Three times a day (TID) | ORAL | 0 refills | Status: DC

## 2018-10-16 NOTE — ED Notes (Signed)
Pt in radiology at this time. 

## 2018-10-16 NOTE — Discharge Instructions (Addendum)
Your x-rays are negative for any acute injuries today.  I am hopeful that this is a simple muscle strain and will heal spontaneously.  You may use the medications prescribed to help with your symptoms and muscle spasm.  I recommend using an ice pack as much as possible to your shoulder for the next 2 days after which you can also apply heating pad for 15 to 20 minutes 2-3 times daily.  Avoid any activity that makes your symptoms worse.

## 2018-10-16 NOTE — ED Triage Notes (Signed)
Pt reports was at work this morning and felt r shoulder "pop."   Denies injury.

## 2018-10-17 NOTE — ED Provider Notes (Signed)
[facility] EMERGENCY DEPARTMENT Provider Note   CSN: [id number] Arrival date & time: 10/16/18  1109    History   Chief Complaint Chief Complaint  Patient presents with  . Shoulder Pain    HPI Todd Randolph is a 38 y.o. male with a history of asthma, amyloidosis and has a heart murmur, presenting with sudden onset of right shoulder pain when he was simply reaching to flip a burger with a spatula.  He reports feeling a sudden painful popping sensation and has had improving pain since the event but is still sore with certain movements, especially overhead reaching.  The episode occurred this morning. He has had no treatment prior to arrival.  Denies weakness or numbness in his arms or hands.  Denies neck pain or stiffness.     The history is provided by the patient.    Past Medical History:  Diagnosis Date  . Amyloidosis ([place])   . Asthma   . Chronic headaches   . Heart murmur     There are no active problems to display for this patient.   Past Surgical History:  Procedure Laterality Date  . ADENOIDECTOMY    . TONSILLECTOMY          Home Medications    Prior to Admission medications   Medication Sig Start Date End Date Taking? Authorizing Provider  cyclobenzaprine (FLEXERIL) 5 MG tablet Take 1 tablet (5 mg total) by mouth 3 (three) times daily as needed for muscle spasms. 10/16/18   Evalee Jefferson, PA-C  ibuprofen (ADVIL) 800 MG tablet Take 1 tablet (800 mg total) by mouth 3 (three) times daily. 10/16/18   Evalee Jefferson, PA-C  ondansetron (ZOFRAN) 4 MG tablet Take 1 tablet (4 mg total) by mouth every 6 (six) hours as needed for nausea or vomiting. 08/20/16   Julianne Rice, MD  pantoprazole (PROTONIX) 20 MG tablet Take 1 tablet (20 mg total) by mouth 2 (two) times daily. 08/20/16   Julianne Rice, MD  sucralfate (CARAFATE) 1 g tablet Take 1 tablet (1 g total) by mouth 3 (three) times daily as needed. 08/20/16   Julianne Rice, MD    Family History No family history on  file.  Social History Social History   Tobacco Use  . Smoking status: Current Every Day Smoker    Packs/day: 0.50    Types: Cigarettes  . Smokeless tobacco: Never Used  Substance Use Topics  . Alcohol use: No  . Drug use: Yes    Types: Marijuana    Comment: once a day     Allergies   Patient has no known allergies.   Review of Systems Review of Systems  Constitutional: Negative for fever.  Musculoskeletal: Positive for arthralgias. Negative for joint swelling and myalgias.  Skin: Negative.  Negative for color change and rash.  Neurological: Negative for weakness and numbness.     Physical Exam Updated Vital Signs BP (!) 139/99 (BP Location: Right Arm)   Pulse 65   Temp 97.9 F (36.6 C) (Oral)   Resp 18   Ht 5\' 9"  (1.753 m)   Wt 81.6 kg   SpO2 100%   BMI 26.58 kg/m   Physical Exam Constitutional:      Appearance: He is well-developed.  HENT:     Head: Atraumatic.  Neck:     Musculoskeletal: Normal range of motion.  Cardiovascular:     Comments: Pulses equal bilaterally Musculoskeletal:        General: Tenderness present.     Right  shoulder: He exhibits tenderness. He exhibits no bony tenderness, no swelling, no effusion, no crepitus, no deformity and no spasm.     Comments: ttp right trapezius along right shoulder to superior scapular spine. No palpable deformity, skin normal coloration without erythema or bruising.  Pt has FROM of right shoulder without crepitus. Pain along trap with overhead rotation.  Skin:    General: Skin is warm and dry.  Neurological:     Mental Status: He is alert.     Sensory: No sensory deficit.     Deep Tendon Reflexes: Reflexes normal.      ED Treatments / Results  Labs (all labs ordered are listed, but only abnormal results are displayed) Labs Reviewed - No data to display  EKG None  Radiology Dg Shoulder Right  Result Date: 10/16/2018 CLINICAL DATA:  Right shoulder pain following a popping sensation this  morning. No known injury. EXAM: RIGHT SHOULDER - 2+ VIEW COMPARISON:  None. FINDINGS: Minimal inferior glenoid spur formation. Otherwise, normal appearing bones and soft tissues. IMPRESSION: Minimal inferior glenoid degenerative spur formation. Otherwise, normal examination. Electronically Signed   By: Beckie SaltsSteven  Reid M.D.   On: 10/16/2018 12:23    Procedures Procedures (including critical care time)  Medications Ordered in ED Medications - No data to display   Initial Impression / Assessment and Plan / ED Course  I have reviewed the triage vital signs and the nursing notes.  Pertinent labs & imaging results that were available during my care of the patient were reviewed by me and considered in my medical decision making (see chart for details).        Imaging reviewed and discussed with pt. Ice/heat tx discussed, activities as tolerated, ibu, flexeril. Prn f/u with ortho if sx do not improve with tx and time.  Referrals given.  No fx or subluxation/dislocation. No deformity, doubt muscle or tendon tear.   Final Clinical Impressions(s) / ED Diagnoses   Final diagnoses:  Strain of right shoulder, initial encounter    ED Discharge Orders         Ordered    ibuprofen (ADVIL) 800 MG tablet  3 times daily     10/16/18 1245    cyclobenzaprine (FLEXERIL) 5 MG tablet  3 times daily PRN     10/16/18 1245           Burgess Amordol, Katalena Malveaux, Cordelia Poche-C 10/17/18 2355    Bethann BerkshireZammit, Joseph, MD 10/19/18 1535

## 2019-10-16 ENCOUNTER — Emergency Department (HOSPITAL_COMMUNITY): Admission: EM | Admit: 2019-10-16 | Discharge: 2019-10-16 | Discharge status: Against Medical Advice | Payer: Self-pay

## 2019-11-06 ENCOUNTER — Other Ambulatory Visit: Payer: Self-pay

## 2019-11-06 ENCOUNTER — Ambulatory Visit
Admission: EM | Admit: 2019-11-06 | Discharge: 2019-11-06 | Disposition: A | Discharge status: Home / Self Care | Payer: Self-pay | Attending: Emergency Medicine | Admitting: Emergency Medicine

## 2019-11-06 DIAGNOSIS — K05219 Aggressive periodontitis, localized, unspecified severity: Secondary | ICD-10-CM

## 2019-11-06 MED ORDER — CEFTRIAXONE SODIUM 1 G IJ SOLR
1.0000 g | Freq: Once | INTRAMUSCULAR | Status: AC
  Administered 2019-11-06: 1 g via INTRAMUSCULAR

## 2019-11-06 MED ORDER — IBUPROFEN 800 MG PO TABS: 800.0000 mg | ORAL_TABLET | Freq: Three times a day (TID) | ORAL | 0 refills | Status: DC

## 2019-11-06 MED ORDER — AMOXICILLIN-POT CLAVULANATE 875-125 MG PO TABS: 1.0000 | ORAL_TABLET | Freq: Two times a day (BID) | ORAL | 0 refills | Status: DC

## 2019-11-06 MED ORDER — CHLORHEXIDINE GLUCONATE 0.12 % MT SOLN: 15.0000 mL | Freq: Two times a day (BID) | OROMUCOSAL | 1 refills | Status: DC

## 2019-11-06 NOTE — ED Triage Notes (Signed)
Pt presents with right side jaw pain and swelling, pt states that had abscess rupture on mouth 2 weeks ago

## 2019-11-06 NOTE — Discharge Instructions (Addendum)
buprofen prescribed.  Use as directed for pain relief Recommend soft diet until evaluated by dentist Maintain oral hygiene care Follow up with dentist as soon as possible for further evaluation and treatment  Return or go to the ED if you have any new or worsening symptoms such as fever, chills, difficulty swallowing, painful swallowing, oral or neck swelling, nausea, vomiting, chest pain, SOB, etc..Marland Kitchen

## 2019-11-06 NOTE — ED Provider Notes (Addendum)
[facility]   [id number] 11/06/19 Arrival Time: 1014  Chief Complaint  Patient presents with  . Jaw Pain     SUBJECTIVE:  Todd Randolph is a 39 y.o. male who presented to the urgent care for complaint of joint swelling and pain for the past 4 days.  Reported an oral abscess ruptured  2 weeks ago.  Denies a precipitating event or trauma.  Localizes pain to the right jaw.  Has tried OTC analgesics without relief.  Worse with chewing.  Does not see a dentist regularly.  Denies similar symptoms in the past.  Denies fever, chills, dysphagia, odynophagia, oral or neck swelling, nausea, vomiting, chest pain, SOB.    ROS: As per HPI.  All other pertinent ROS negative.      Past Medical History:  Diagnosis Date  . Amyloidosis (HCC)   . Asthma   . Chronic headaches   . Heart murmur    Past Surgical History:  Procedure Laterality Date  . ADENOIDECTOMY    . TONSILLECTOMY     No Known Allergies No current facility-administered medications on file prior to encounter.   Current Outpatient Medications on File Prior to Encounter  Medication Sig Dispense Refill  . cyclobenzaprine (FLEXERIL) 5 MG tablet Take 1 tablet (5 mg total) by mouth 3 (three) times daily as needed for muscle spasms. 15 tablet 0  . ondansetron (ZOFRAN) 4 MG tablet Take 1 tablet (4 mg total) by mouth every 6 (six) hours as needed for nausea or vomiting. 12 tablet 0  . pantoprazole (PROTONIX) 20 MG tablet Take 1 tablet (20 mg total) by mouth 2 (two) times daily. 60 tablet 0  . sucralfate (CARAFATE) 1 g tablet Take 1 tablet (1 g total) by mouth 3 (three) times daily as needed. 90 tablet 0   Social History   Socioeconomic History  . Marital status: Married    Spouse name: Not on file  . Number of children: Not on file  . Years of education: Not on file  . Highest education level: Not on file  Occupational History  . Not on file  Tobacco Use  . Smoking status: Current Every Day Smoker    Packs/day: 0.50     Types: Cigarettes  . Smokeless tobacco: Never Used  Vaping Use  . Vaping Use: Never used  Substance and Sexual Activity  . Alcohol use: No  . Drug use: Yes    Types: Marijuana    Comment: once a day  . Sexual activity: Not on file  Other Topics Concern  . Not on file  Social History Narrative  . Not on file   Social Determinants of Health   Financial Resource Strain:   . Difficulty of Paying Living Expenses:   Food Insecurity:   . Worried About Programme researcher, broadcasting/film/video in the Last Year:   . Barista in the Last Year:   Transportation Needs:   . Freight forwarder (Medical):   Marland Kitchen Lack of Transportation (Non-Medical):   Physical Activity:   . Days of Exercise per Week:   . Minutes of Exercise per Session:   Stress:   . Feeling of Stress :   Social Connections:   . Frequency of Communication with Friends and Family:   . Frequency of Social Gatherings with Friends and Family:   . Attends Religious Services:   . Active Member of Clubs or Organizations:   . Attends Banker Meetings:   Marland Kitchen Marital Status:  Intimate Partner Violence:   . Fear of Current or Ex-Partner:   . Emotionally Abused:   Marland Kitchen Physically Abused:   . Sexually Abused:    Family History  Family history unknown: Yes    OBJECTIVE:  Vitals:   11/06/19 1041  BP: 131/88  Pulse: 89  Resp: 18  Temp: 99.3 F (37.4 C)  SpO2: 98%    Physical Exam Vitals and nursing note reviewed.  Constitutional:      General: He is not in acute distress.    Appearance: Normal appearance. He is normal weight. He is not ill-appearing, toxic-appearing or diaphoretic.  HENT:     Head:     Jaw: Tenderness, swelling and pain on movement present.      Mouth/Throat:     Lips: Pink.     Mouth: Mucous membranes are moist.     Dentition: Abnormal dentition. Dental caries present.     Comments: gingival abscess on  right lower gum Cardiovascular:     Rate and Rhythm: Normal rate and regular rhythm.      Pulses: Normal pulses.     Heart sounds: Normal heart sounds. No murmur heard.  No friction rub. No gallop.   Pulmonary:     Effort: Pulmonary effort is normal. No respiratory distress.     Breath sounds: Normal breath sounds. No stridor. No wheezing, rhonchi or rales.  Chest:     Chest wall: No tenderness.  Neurological:     Mental Status: He is alert.      ASSESSMENT & PLAN:  1. Gingival abscess     Meds ordered this encounter  Medications  . cefTRIAXone (ROCEPHIN) injection 1 g  . amoxicillin-clavulanate (AUGMENTIN) 875-125 MG tablet    Sig: Take 1 tablet by mouth every 12 (twelve) hours.    Dispense:  14 tablet    Refill:  0  . ibuprofen (ADVIL) 800 MG tablet    Sig: Take 1 tablet (800 mg total) by mouth 3 (three) times daily.    Dispense:  21 tablet    Refill:  0  . chlorhexidine (PERIDEX) 0.12 % solution    Sig: Use as directed 15 mLs in the mouth or throat 2 (two) times daily.    Dispense:  120 mL    Refill:  1    Discharge Instructions Ibuprofen prescribed.  Use as directed for pain relief Recommend soft diet until evaluated by dentist Maintain oral hygiene care Follow up with dentist as soon as possible for further evaluation and treatment  Return or go to the ED if you have any new or worsening symptoms such as fever, chills, difficulty swallowing, painful swallowing, oral or neck swelling, nausea, vomiting, chest pain, SOB, etc...  Reviewed expectations re: course of current medical issues. Questions answered. Outlined signs and symptoms indicating need for more acute intervention. Patient verbalized understanding. After Visit Summary given.         Note: This document was prepared using Dragon voice recognition software and may include unintentional dictation errors.    Durward Parcel, FNP 11/06/19 1124    Durward Parcel, FNP 11/06/19 1129

## 2020-01-08 ENCOUNTER — Other Ambulatory Visit: Payer: Self-pay

## 2020-01-08 ENCOUNTER — Emergency Department (HOSPITAL_COMMUNITY)
Admission: EM | Admit: 2020-01-08 | Discharge: 2020-01-08 | Disposition: A | Discharge status: Home / Self Care | Payer: Self-pay | Attending: Emergency Medicine | Admitting: Emergency Medicine

## 2020-01-08 ENCOUNTER — Encounter (HOSPITAL_COMMUNITY): Payer: Self-pay | Admitting: Emergency Medicine

## 2020-01-08 ENCOUNTER — Emergency Department (HOSPITAL_COMMUNITY): Payer: Self-pay

## 2020-01-08 DIAGNOSIS — R0789 Other chest pain: Secondary | ICD-10-CM | POA: Insufficient documentation

## 2020-01-08 DIAGNOSIS — J45909 Unspecified asthma, uncomplicated: Secondary | ICD-10-CM | POA: Insufficient documentation

## 2020-01-08 DIAGNOSIS — F17210 Nicotine dependence, cigarettes, uncomplicated: Secondary | ICD-10-CM | POA: Insufficient documentation

## 2020-01-08 LAB — CBC
HCT: 47.4 % (ref 39.0–52.0)
Hemoglobin: 15.5 g/dL (ref 13.0–17.0)
MCH: 29.9 pg (ref 26.0–34.0)
MCHC: 32.7 g/dL (ref 30.0–36.0)
MCV: 91.5 fL (ref 80.0–100.0)
Platelets: 183 10*3/uL (ref 150–400)
RBC: 5.18 MIL/uL (ref 4.22–5.81)
RDW: 14.2 % (ref 11.5–15.5)
WBC: 5.2 10*3/uL (ref 4.0–10.5)
nRBC: 0 % (ref 0.0–0.2)

## 2020-01-08 LAB — BASIC METABOLIC PANEL
Anion gap: 11 (ref 5–15)
BUN: 12 mg/dL (ref 6–20)
CO2: 26 mmol/L (ref 22–32)
Calcium: 9.7 mg/dL (ref 8.9–10.3)
Chloride: 101 mmol/L (ref 98–111)
Creatinine, Ser: 1.15 mg/dL (ref 0.61–1.24)
GFR calc non Af Amer: >60 mL/min (ref >60)
Glucose, Bld: 92 mg/dL (ref 70–99)
Potassium: 3.7 mmol/L (ref 3.5–5.1)
Sodium: 138 mmol/L (ref 135–145)

## 2020-01-08 LAB — TROPONIN I (HIGH SENSITIVITY)
Troponin I (High Sensitivity): <2 ng/L (ref <18)
Troponin I (High Sensitivity): <2 ng/L (ref <18)

## 2020-01-08 MED ORDER — LIDOCAINE VISCOUS HCL 2 % MT SOLN
15.0000 mL | Freq: Once | OROMUCOSAL | Status: AC
  Administered 2020-01-08: 15 mL via ORAL
  Filled 2020-01-08: qty 15

## 2020-01-08 MED ORDER — ALUM & MAG HYDROXIDE-SIMETH 200-200-20 MG/5ML PO SUSP
30.0000 mL | Freq: Once | ORAL | Status: AC
  Administered 2020-01-08: 30 mL via ORAL
  Filled 2020-01-08: qty 30

## 2020-01-08 MED ORDER — PANTOPRAZOLE SODIUM 20 MG PO TBEC: 20.0000 mg | DELAYED_RELEASE_TABLET | Freq: Every day | ORAL | 1 refills | Status: DC

## 2020-01-08 NOTE — ED Provider Notes (Signed)
[facility] EMERGENCY DEPARTMENT Provider Note   CSN: [id number] Arrival date & time: 01/08/20  0957     History Chief Complaint  Patient presents with  . Chest Pain    Todd Randolph is a 39 y.o. male.  Patient complains of chest pain for couple weeks.  Patient points to his xiphoid process where the pain is  The history is provided by the patient and medical records. No language interpreter was used.  Chest Pain Pain location:  Substernal area Pain quality: aching   Pain radiates to:  Does not radiate Pain severity:  Mild Onset quality:  Sudden Timing:  Constant Progression:  Unchanged Chronicity:  New Context: not breathing   Associated symptoms: no abdominal pain, no back pain, no cough, no fatigue and no headache        Past Medical History:  Diagnosis Date  . Amyloidosis (HCC)   . Asthma   . Chronic headaches   . Heart murmur     There are no problems to display for this patient.   Past Surgical History:  Procedure Laterality Date  . ADENOIDECTOMY    . TONSILLECTOMY         Family History  Family history unknown: Yes    Social History   Tobacco Use  . Smoking status: Current Every Day Smoker    Packs/day: 0.50    Types: Cigarettes  . Smokeless tobacco: Never Used  Vaping Use  . Vaping Use: Never used  Substance Use Topics  . Alcohol use: No  . Drug use: Yes    Types: Marijuana    Comment: once a day    Home Medications Prior to Admission medications   Medication Sig Start Date End Date Taking? Authorizing Provider  amoxicillin-clavulanate (AUGMENTIN) 875-125 MG tablet Take 1 tablet by mouth every 12 (twelve) hours. Patient not taking: Reported on 01/08/2020 11/06/19   Durward Parcel, FNP  chlorhexidine (PERIDEX) 0.12 % solution Use as directed 15 mLs in the mouth or throat 2 (two) times daily. Patient not taking: Reported on 01/08/2020 11/06/19   Durward Parcel, FNP  cyclobenzaprine (FLEXERIL) 5 MG tablet Take 1 tablet (5 mg  total) by mouth 3 (three) times daily as needed for muscle spasms. Patient not taking: Reported on 01/08/2020 10/16/18   Burgess Amor, PA-C  ibuprofen (ADVIL) 800 MG tablet Take 1 tablet (800 mg total) by mouth 3 (three) times daily. Patient not taking: Reported on 01/08/2020 11/06/19   Durward Parcel, FNP  ondansetron (ZOFRAN) 4 MG tablet Take 1 tablet (4 mg total) by mouth every 6 (six) hours as needed for nausea or vomiting. 08/20/16   Loren Racer, MD  pantoprazole (PROTONIX) 20 MG tablet Take 1 tablet (20 mg total) by mouth daily. 01/08/20   Bethann Berkshire, MD  sucralfate (CARAFATE) 1 g tablet Take 1 tablet (1 g total) by mouth 3 (three) times daily as needed. Patient not taking: Reported on 01/08/2020 08/20/16   Loren Racer, MD    Allergies    Patient has no known allergies.  Review of Systems   Review of Systems  Constitutional: Negative for appetite change and fatigue.  HENT: Negative for congestion, ear discharge and sinus pressure.   Eyes: Negative for discharge.  Respiratory: Negative for cough.   Cardiovascular: Positive for chest pain.  Gastrointestinal: Negative for abdominal pain and diarrhea.  Genitourinary: Negative for frequency and hematuria.  Musculoskeletal: Negative for back pain.  Skin: Negative for rash.  Neurological: Negative for seizures and headaches.  Psychiatric/Behavioral: Negative for hallucinations.    Physical Exam Updated Vital Signs BP 124/85   Pulse 67   Temp 99.2 F (37.3 C) (Oral)   Resp (!) 24   Ht 5\' 11"  (1.803 m)   Wt 86.2 kg   SpO2 99%   BMI 26.50 kg/m   Physical Exam Vitals and nursing note reviewed.  Constitutional:      Appearance: He is well-developed.  HENT:     Head: Normocephalic.     Nose: Nose normal.  Eyes:     General: No scleral icterus.    Conjunctiva/sclera: Conjunctivae normal.  Neck:     Thyroid: No thyromegaly.  Cardiovascular:     Rate and Rhythm: Normal rate and regular rhythm.     Heart sounds: No  murmur heard.  No friction rub. No gallop.   Pulmonary:     Breath sounds: No stridor. No wheezing or rales.  Chest:     Chest wall: No tenderness.  Abdominal:     General: There is no distension.     Tenderness: There is abdominal tenderness. There is no rebound.     Comments: Tender epigastric  Musculoskeletal:        General: Normal range of motion.     Cervical back: Neck supple.  Lymphadenopathy:     Cervical: No cervical adenopathy.  Skin:    Findings: No erythema or rash.  Neurological:     Mental Status: He is alert and oriented to person, place, and time.     Motor: No abnormal muscle tone.     Coordination: Coordination normal.  Psychiatric:        Behavior: Behavior normal.     ED Results / Procedures / Treatments   Labs (all labs ordered are listed, but only abnormal results are displayed) Labs Reviewed  BASIC METABOLIC PANEL  CBC  TROPONIN I (HIGH SENSITIVITY)  TROPONIN I (HIGH SENSITIVITY)    EKG EKG Interpretation  Date/Time:  Thursday January 08 2020 10:06:54 EDT Ventricular Rate:  94 PR Interval:  140 QRS Duration: 84 QT Interval:  346 QTC Calculation: 432 R Axis:   81 Text Interpretation: Normal sinus rhythm Normal ECG Confirmed by 09-22-1994 [phone]) on 01/08/2020 11:47:08 AM Also confirmed by 03/09/2020 [phone])  on 01/08/2020 1:52:32 PM   Radiology DG Chest 2 View  Result Date: 01/08/2020 CLINICAL DATA:  Chest pain EXAM: CHEST - 2 VIEW COMPARISON:  September 06, 2014 FINDINGS: The cardiomediastinal silhouette is normal in contour. No pleural effusion. No pneumothorax. No acute pleuroparenchymal abnormality. Visualized abdomen is unremarkable. No acute osseous abnormality noted. IMPRESSION: No acute cardiopulmonary abnormality. Electronically Signed   By: September 08, 2014 MD   On: 01/08/2020 10:34    Procedures Procedures (including critical care time)  Medications Ordered in ED Medications  alum & mag hydroxide-simeth (MAALOX/MYLANTA)  200-200-20 MG/5ML suspension 30 mL (30 mLs Oral Given 01/08/20 1203)    And  lidocaine (XYLOCAINE) 2 % viscous mouth solution 15 mL (15 mLs Oral Given 01/08/20 1203)    ED Course  I have reviewed the triage vital signs and the nursing notes.  Pertinent labs & imaging results that were available during my care of the patient were reviewed by me and considered in my medical decision making (see chart for details).    MDM Rules/Calculators/A&P                         Patient's pain was improved with GI cocktail.  He will be sent home with protonic.  Patient is labs and x-rays unremarkable     ,jz  This patient presents to the ED for concern of chest pain this involves an extensive number of treatment options, and is a complaint that carries with it a high risk of complications and morbidity.  The differential diagnosis includes MI GERD   Lab Tests:   I Ordered, reviewed, and interpreted labs, which included CBC chemistries troponin all unremarkable  Medicines ordered:   I ordered medication GI cocktail for gastritis  Imaging Studies ordered:   I ordered imaging studies which included chest x-ray  I independently visualized and interpreted imaging which showed negative  Additional history obtained:   Additional history obtained from mother  Previous records obtained and reviewed.  Consultations Obtained:     Reevaluation:  After the interventions stated above, I reevaluated the patient and found improved  Critical Interventions:  .   Final Clinical Impression(s) / ED Diagnoses Final diagnoses:  Atypical chest pain    Rx / DC Orders ED Discharge Orders         Ordered    pantoprazole (PROTONIX) 20 MG tablet  Daily        01/08/20 1355           Bethann Berkshire, MD 01/09/20 [phone]

## 2020-01-08 NOTE — ED Notes (Signed)
Pt verbalized hx with Genella Rife and he did eat spicy food last night

## 2020-01-08 NOTE — ED Triage Notes (Signed)
Pt c/o cp that started 3-4 days ago but was worse lastnight.

## 2020-01-08 NOTE — Discharge Instructions (Addendum)
Follow-up with your family doctor sometime in the next couple weeks

## 2021-06-24 ENCOUNTER — Other Ambulatory Visit: Payer: Self-pay

## 2021-06-24 ENCOUNTER — Encounter (HOSPITAL_COMMUNITY): Payer: Self-pay

## 2021-06-24 ENCOUNTER — Emergency Department (HOSPITAL_COMMUNITY)
Admission: EM | Admit: 2021-06-24 | Discharge: 2021-06-24 | Disposition: A | Discharge status: Home / Self Care | Payer: Self-pay | Attending: Emergency Medicine | Admitting: Emergency Medicine

## 2021-06-24 DIAGNOSIS — H5789 Other specified disorders of eye and adnexa: Secondary | ICD-10-CM | POA: Insufficient documentation

## 2021-06-24 DIAGNOSIS — R0981 Nasal congestion: Secondary | ICD-10-CM | POA: Insufficient documentation

## 2021-06-24 DIAGNOSIS — R519 Headache, unspecified: Secondary | ICD-10-CM | POA: Insufficient documentation

## 2021-06-24 DIAGNOSIS — J302 Other seasonal allergic rhinitis: Secondary | ICD-10-CM

## 2021-06-24 MED ORDER — CETIRIZINE HCL 10 MG PO TABS: 10.0000 mg | ORAL_TABLET | Freq: Every day | ORAL | 1 refills | Status: DC

## 2021-06-24 NOTE — ED Provider Notes (Signed)
?[facility] EMERGENCY DEPARTMENT ?Provider Note ? ? ?CSN: [id number] ?Arrival date & time: 06/24/21  1228 ? ?  ? ?History ? ?Chief Complaint  ?Patient presents with  ? Eye Problem  ? ? ?Todd Randolph is a 41 y.o. male. ? ? ?Eye Problem ? ?Patient is a 41 year old male presenting today with headache and eye irritation x4 days.  They come and go, associated with nasal congestion.  States she is has been like this a few years ago that improved with antihistamines.  Endorses going outside as a trigger for his symptoms.  Denies any purulent discharge, no sick contacts.  Does not wear contacts or glasses. ? ?Home Medications ?Prior to Admission medications   ?Medication Sig Start Date End Date Taking? Authorizing Provider  ?amoxicillin-clavulanate (AUGMENTIN) 875-125 MG tablet Take 1 tablet by mouth every 12 (twelve) hours. ?Patient not taking: Reported on 01/08/2020 11/06/19   Durward Parcel, FNP  ?chlorhexidine (PERIDEX) 0.12 % solution Use as directed 15 mLs in the mouth or throat 2 (two) times daily. ?Patient not taking: Reported on 01/08/2020 11/06/19   Durward Parcel, FNP  ?cyclobenzaprine (FLEXERIL) 5 MG tablet Take 1 tablet (5 mg total) by mouth 3 (three) times daily as needed for muscle spasms. ?Patient not taking: Reported on 01/08/2020 10/16/18   Burgess Amor, PA-C  ?ibuprofen (ADVIL) 800 MG tablet Take 1 tablet (800 mg total) by mouth 3 (three) times daily. ?Patient not taking: Reported on 01/08/2020 11/06/19   Durward Parcel, FNP  ?ondansetron (ZOFRAN) 4 MG tablet Take 1 tablet (4 mg total) by mouth every 6 (six) hours as needed for nausea or vomiting. 08/20/16   Loren Racer, MD  ?pantoprazole (PROTONIX) 20 MG tablet Take 1 tablet (20 mg total) by mouth daily. 01/08/20   Bethann Berkshire, MD  ?sucralfate (CARAFATE) 1 g tablet Take 1 tablet (1 g total) by mouth 3 (three) times daily as needed. ?Patient not taking: Reported on 01/08/2020 08/20/16   Loren Racer, MD  ?   ? ?Allergies    ?Patient has no known  allergies.   ? ?Review of Systems   ?Review of Systems ? ?Physical Exam ?Updated Vital Signs ?BP 121/86 (BP Location: Right Arm)   Pulse 85   Temp 99.3 ?F (37.4 ?C) (Oral)   Resp 18   Ht 6' (1.829 m)   Wt 81.6 kg   SpO2 98%   BMI 24.41 kg/m?  ?Physical Exam ?Vitals and nursing note reviewed. Exam conducted with a chaperone present.  ?Constitutional:   ?   General: He is not in acute distress. ?   Appearance: Normal appearance.  ?HENT:  ?   Head: Normocephalic and atraumatic.  ?   Nose: Congestion present.  ?Eyes:  ?   General: No scleral icterus. ?   Extraocular Movements: Extraocular movements intact.  ?   Pupils: Pupils are equal, round, and reactive to light.  ?   Comments: EOMI, PERRLA.  No visual field deficit, no nystagmus.  Eyelids without edema, no discharge  ?Skin: ?   Coloration: Skin is not jaundiced.  ?Neurological:  ?   Mental Status: He is alert. Mental status is at baseline.  ?   Coordination: Coordination normal.  ?   Comments: Cranial nerves III through XII are grossly intact  ? ? ?ED Results / Procedures / Treatments   ?Labs ?(all labs ordered are listed, but only abnormal results are displayed) ?Labs Reviewed - No data to display ? ?EKG ?None ? ?Radiology ?No results found. ? ?  Procedures ?Procedures  ? ? ?Medications Ordered in ED ?Medications - No data to display ? ?ED Course/ Medical Decision Making/ A&P ?  ?                        ?Medical Decision Making ? ?This is a 41 year old male presenting today with headaches and allergy symptoms x4 days.  EOMI, no focal deficits on neuro exam.  He denies any visual deficits and does not have any obvious visual impairment or acuities.  On exam there is no surrounding erythema or edema that be suggestive of a cellulitis, no purulent discharge,.  Considered bacterial conjunctivitis seems unlikely, also considered orbital cellulitis also seems unlikely.  Temperature slightly elevated 99.3 but he is not febrile does not have any viral symptoms.  I  suspect his symptoms are most likely consistent with allergies.  Will discharge with antihistamine. ? ? ? ? ? ? ? ?Final Clinical Impression(s) / ED Diagnoses ?Final diagnoses:  ?None  ? ? ?Rx / DC Orders ?ED Discharge Orders   ? ? None  ? ?  ? ? ?  ?Theron Arista, PA-C ?06/24/21 1301 ? ?  ?Vanetta Mulders, MD ?06/27/21 0719 ? ?

## 2021-06-24 NOTE — ED Triage Notes (Signed)
Patient reports headache, eye puffiness and having drainage that crusted and caused his eyes to seal shut.   ?

## 2021-06-24 NOTE — Discharge Instructions (Signed)
Take Zyrtec daily for the next few days.  It may take a few days to notice improvement.  He can take ibuprofen and Tylenol for the headaches.  Return if you lose vision, have any weakness on one side of your body or things significantly change or worsen. ?

## 2021-08-17 ENCOUNTER — Emergency Department (HOSPITAL_COMMUNITY)
Admission: EM | Admit: 2021-08-17 | Discharge: 2021-08-17 | Disposition: A | Discharge status: Home / Self Care | Payer: Self-pay | Attending: Emergency Medicine | Admitting: Emergency Medicine

## 2021-08-17 ENCOUNTER — Other Ambulatory Visit: Payer: Self-pay

## 2021-08-17 ENCOUNTER — Encounter (HOSPITAL_COMMUNITY): Payer: Self-pay

## 2021-08-17 DIAGNOSIS — K625 Hemorrhage of anus and rectum: Secondary | ICD-10-CM | POA: Insufficient documentation

## 2021-08-17 LAB — COMPREHENSIVE METABOLIC PANEL
ALT: 11 U/L (ref 0–44)
AST: 20 U/L (ref 15–41)
Albumin: 4.2 g/dL (ref 3.5–5.0)
Alkaline Phosphatase: 39 U/L (ref 38–126)
Anion gap: 8 (ref 5–15)
BUN: 11 mg/dL (ref 6–20)
CO2: 26 mmol/L (ref 22–32)
Calcium: 9.1 mg/dL (ref 8.9–10.3)
Chloride: 106 mmol/L (ref 98–111)
Creatinine, Ser: 1.2 mg/dL (ref 0.61–1.24)
GFR, Estimated: >60 mL/min (ref >60)
Glucose, Bld: 98 mg/dL (ref 70–99)
Potassium: 4 mmol/L (ref 3.5–5.1)
Sodium: 140 mmol/L (ref 135–145)
Total Bilirubin: 0.3 mg/dL (ref 0.3–1.2)
Total Protein: 7.2 g/dL (ref 6.5–8.1)

## 2021-08-17 LAB — CBC
HCT: 44.3 % (ref 39.0–52.0)
Hemoglobin: 14.4 g/dL (ref 13.0–17.0)
MCH: 29.9 pg (ref 26.0–34.0)
MCHC: 32.5 g/dL (ref 30.0–36.0)
MCV: 92.1 fL (ref 80.0–100.0)
Platelets: 213 10*3/uL (ref 150–400)
RBC: 4.81 MIL/uL (ref 4.22–5.81)
RDW: 14.2 % (ref 11.5–15.5)
WBC: 5.3 10*3/uL (ref 4.0–10.5)
nRBC: 0 % (ref 0.0–0.2)

## 2021-08-17 MED ORDER — HYDROCORTISONE (PERIANAL) 2.5 % EX CREA: 1.0000 "application " | TOPICAL_CREAM | Freq: Two times a day (BID) | CUTANEOUS | 0 refills | Status: DC

## 2021-08-17 NOTE — ED Provider Notes (Signed)
?Portal ?Provider Note ? ? ?CSN: [id number] ?Arrival date & time: 08/17/21  0901 ? ?  ? ?History ? ?Chief Complaint  ?Patient presents with  ? Rectal Bleeding  ? ? ?Todd Randolph is a 41 y.o. male. ? ? ?Rectal Bleeding ?Associated symptoms: no abdominal pain, no dizziness, no fever, no light-headedness and no vomiting   ? ?  ? ?Todd Randolph is a 41 y.o. male who presents to the Emergency Department complaining of noticing bright red blood from his rectum this morning after defecating.  He noticed bright red blood in the toilet and on tissue after wiping.  States he does have history of hemorrhoids but does not currently see a hemorrhoid present.  He does admit to sitting on the commode straining to have a bowel movement.  He denies any abdominal pain, dizziness, syncope, generalized weakness.  He also denies any increased or excessive NSAID use. ? ?Home Medications ?Prior to Admission medications   ?Medication Sig Start Date End Date Taking? Authorizing Provider  ?hydrocortisone (ANUSOL-HC) 2.5 % rectal cream Place 1 application. rectally 2 (two) times daily. 08/17/21  Yes Rondi Ivy, PA-C  ?amoxicillin-clavulanate (AUGMENTIN) 875-125 MG tablet Take 1 tablet by mouth every 12 (twelve) hours. ?Patient not taking: Reported on 01/08/2020 11/06/19   Emerson Monte, FNP  ?cetirizine (ZYRTEC ALLERGY) 10 MG tablet Take 1 tablet (10 mg total) by mouth daily. 06/24/21   Sherrill Raring, PA-C  ?chlorhexidine (PERIDEX) 0.12 % solution Use as directed 15 mLs in the mouth or throat 2 (two) times daily. ?Patient not taking: Reported on 01/08/2020 11/06/19   Emerson Monte, FNP  ?cyclobenzaprine (FLEXERIL) 5 MG tablet Take 1 tablet (5 mg total) by mouth 3 (three) times daily as needed for muscle spasms. ?Patient not taking: Reported on 01/08/2020 10/16/18   Evalee Jefferson, PA-C  ?ibuprofen (ADVIL) 800 MG tablet Take 1 tablet (800 mg total) by mouth 3 (three) times daily. ?Patient not taking: Reported on  01/08/2020 11/06/19   Emerson Monte, FNP  ?ondansetron (ZOFRAN) 4 MG tablet Take 1 tablet (4 mg total) by mouth every 6 (six) hours as needed for nausea or vomiting. 08/20/16   Julianne Rice, MD  ?pantoprazole (PROTONIX) 20 MG tablet Take 1 tablet (20 mg total) by mouth daily. 01/08/20   Milton Ferguson, MD  ?sucralfate (CARAFATE) 1 g tablet Take 1 tablet (1 g total) by mouth 3 (three) times daily as needed. ?Patient not taking: Reported on 01/08/2020 08/20/16   Julianne Rice, MD  ?   ? ?Allergies    ?Patient has no known allergies.   ? ?Review of Systems   ?Review of Systems  ?Constitutional:  Negative for fever.  ?Respiratory:  Negative for shortness of breath.   ?Cardiovascular:  Negative for chest pain.  ?Gastrointestinal:  Positive for anal bleeding, constipation and hematochezia. Negative for abdominal distention, abdominal pain, nausea and vomiting.  ?Genitourinary:  Negative for difficulty urinating and hematuria.  ?Neurological:  Negative for dizziness, syncope and light-headedness.  ?All other systems reviewed and are negative. ? ?Physical Exam ?Updated Vital Signs ?BP 123/83 (BP Location: Right Arm)   Pulse 82   Temp 98.2 ?F (36.8 ?C) (Oral)   Resp 20   Ht 6' (1.829 m)   Wt 81.6 kg   SpO2 98%   BMI 24.41 kg/m?  ?Physical Exam ?Vitals and nursing note reviewed.  ?Constitutional:   ?   General: He is not in acute distress. ?   Appearance: Normal appearance. He is  not ill-appearing.  ?Cardiovascular:  ?   Rate and Rhythm: Normal rate and regular rhythm.  ?   Pulses: Normal pulses.  ?Pulmonary:  ?   Effort: Pulmonary effort is normal.  ?   Breath sounds: Normal breath sounds.  ?Abdominal:  ?   General: There is no distension.  ?   Palpations: Abdomen is soft. There is no mass.  ?   Tenderness: There is no abdominal tenderness.  ?Genitourinary: ?   Comments: Attempted rectal exam but patient declined ?Skin: ?   General: Skin is warm.  ?Neurological:  ?   General: No focal deficit present.  ?   Mental  Status: He is alert.  ? ? ?ED Results / Procedures / Treatments   ?Labs ?(all labs ordered are listed, but only abnormal results are displayed) ?Labs Reviewed  ?COMPREHENSIVE METABOLIC PANEL  ?CBC  ?POC OCCULT BLOOD, ED  ? ? ?EKG ?None ? ?Radiology ?No results found. ? ?Procedures ?Procedures  ? ? ?Medications Ordered in ED ?Medications - No data to display ? ?ED Course/ Medical Decision Making/ A&P ?  ?                        ?Medical Decision Making ?Amount and/or Complexity of Data Reviewed ?Labs: ordered. ? ?Risk ?Prescription drug management. ? ? ? ? ?On exam, patient is well-appearing nontoxic.  Abdomen is soft and nontender.  Vital signs are reassuring. ? ?Attempted rectal exam but patient declined.  Stating that he has history of hemorrhoids but did not see a protruding hemorrhoid this morning. ? ? ?Labs reviewed by me, no evidence of leukocytosis or anemia.  Reported bright red blood after defecating and history of hemorrhoids.  I feel that his rectal bleeding is secondary to hemorrhoid versus anal fissure.  No abdominal tenderness, fever, vomiting to suggest acute abdominal process or infection.  Discussed in detail importance of increased hydration and dietary fiber.  Avoid straining to have bowel movement.  Patient verbalized understanding agreeable to plan.  Given follow-up information for general surgery if needed for recurrent hemorrhoid, return precautions were discussed. ? ? ? ? ? ? ? ?Final Clinical Impression(s) / ED Diagnoses ?Final diagnoses:  ?Rectal bleeding  ? ? ?Rx / DC Orders ?ED Discharge Orders   ? ?      Ordered  ?  hydrocortisone (ANUSOL-HC) 2.5 % rectal cream  2 times daily       ? 08/17/21 1125  ? ?  ?  ? ?  ? ? ?  ?Kem Parkinson, PA-C ?08/17/21 1134 ? ?  ?Wyvonnia Dusky, MD ?08/17/21 1544 ? ?

## 2021-08-17 NOTE — ED Triage Notes (Signed)
Patient with complaints of rectal bleeding this morning after a bowel movement. Does have history of hemorrhoids.   ?

## 2021-08-17 NOTE — Discharge Instructions (Signed)
The bleeding you saw from your rectum is likely related to a hemorrhoid or a small tear to your rectum.  This is usually caused by straining to have a bowel movement or constipation.  It is important that you drink plenty of water and increase dietary fiber intake.  You can do this from leafy green vegetables and fruits.  Try sitz bath and mildly warm water to help with your symptoms.  Use the prescription cream as directed.  Follow-up with your primary doctor or return here for any new or worsening symptoms. ?

## 2021-09-12 ENCOUNTER — Emergency Department (HOSPITAL_COMMUNITY)
Admission: EM | Admit: 2021-09-12 | Discharge: 2021-09-12 | Disposition: A | Discharge status: Home / Self Care | Payer: Self-pay | Attending: Emergency Medicine | Admitting: Emergency Medicine

## 2021-09-12 ENCOUNTER — Emergency Department (HOSPITAL_COMMUNITY): Payer: Self-pay

## 2021-09-12 ENCOUNTER — Other Ambulatory Visit: Payer: Self-pay

## 2021-09-12 ENCOUNTER — Encounter (HOSPITAL_COMMUNITY): Payer: Self-pay | Admitting: Emergency Medicine

## 2021-09-12 DIAGNOSIS — U071 COVID-19: Secondary | ICD-10-CM | POA: Insufficient documentation

## 2021-09-12 DIAGNOSIS — J45909 Unspecified asthma, uncomplicated: Secondary | ICD-10-CM | POA: Insufficient documentation

## 2021-09-12 LAB — SARS CORONAVIRUS 2 BY RT PCR: SARS Coronavirus 2 by RT PCR: POSITIVE — AB

## 2021-09-12 MED ORDER — IBUPROFEN 400 MG PO TABS
600.0000 mg | ORAL_TABLET | Freq: Once | ORAL | Status: AC
  Administered 2021-09-12: 600 mg via ORAL
  Filled 2021-09-12: qty 2

## 2021-09-12 MED ORDER — IBUPROFEN 800 MG PO TABS: 800.0000 mg | ORAL_TABLET | Freq: Three times a day (TID) | ORAL | 0 refills | Status: DC

## 2021-09-12 NOTE — Discharge Instructions (Signed)
Return to the ER if you have difficulty breathing or worsening symptoms.  Otherwise call your primary care doctor in 3 to 4 days to arrange video follow-up.

## 2021-09-12 NOTE — ED Triage Notes (Signed)
Per pt having flu like symptoms, fatigue, body aches, fever.

## 2021-09-12 NOTE — ED Notes (Signed)
Notified provider that pt is requesting an prescription for 800mg  ibuprofen

## 2021-09-12 NOTE — ED Provider Notes (Signed)
[facility] EMERGENCY DEPARTMENT Provider Note   CSN: [id number] Arrival date & time: 09/12/21  1403     History  Chief Complaint  Patient presents with   Flu Symptoms    Todd Randolph is a 41 y.o. male.  Patient with history of asthma, malignancies, presents with body aches cough fevers ongoing for 3 to 4 days.  Denies chest pain or shortness of breath.  He states he has not been vaccinated for COVID.       Home Medications Prior to Admission medications   Medication Sig Start Date End Date Taking? Authorizing Provider  amoxicillin-clavulanate (AUGMENTIN) 875-125 MG tablet Take 1 tablet by mouth every 12 (twelve) hours. Patient not taking: Reported on 01/08/2020 11/06/19   Durward Parcel, FNP  cetirizine (ZYRTEC ALLERGY) 10 MG tablet Take 1 tablet (10 mg total) by mouth daily. 06/24/21   Theron Arista, PA-C  chlorhexidine (PERIDEX) 0.12 % solution Use as directed 15 mLs in the mouth or throat 2 (two) times daily. Patient not taking: Reported on 01/08/2020 11/06/19   Durward Parcel, FNP  cyclobenzaprine (FLEXERIL) 5 MG tablet Take 1 tablet (5 mg total) by mouth 3 (three) times daily as needed for muscle spasms. Patient not taking: Reported on 01/08/2020 10/16/18   Burgess Amor, PA-C  hydrocortisone (ANUSOL-HC) 2.5 % rectal cream Place 1 application. rectally 2 (two) times daily. 08/17/21   Triplett, Tammy, PA-C  ibuprofen (ADVIL) 800 MG tablet Take 1 tablet (800 mg total) by mouth 3 (three) times daily. Patient not taking: Reported on 01/08/2020 11/06/19   Durward Parcel, FNP  ondansetron (ZOFRAN) 4 MG tablet Take 1 tablet (4 mg total) by mouth every 6 (six) hours as needed for nausea or vomiting. 08/20/16   Loren Racer, MD  pantoprazole (PROTONIX) 20 MG tablet Take 1 tablet (20 mg total) by mouth daily. 01/08/20   Bethann Berkshire, MD  sucralfate (CARAFATE) 1 g tablet Take 1 tablet (1 g total) by mouth 3 (three) times daily as needed. Patient not taking: Reported on 01/08/2020  08/20/16   Loren Racer, MD      Allergies    Patient has no known allergies.    Review of Systems   Review of Systems  Constitutional:  Positive for fever.  HENT:  Negative for ear pain and sore throat.   Eyes:  Negative for pain.  Respiratory:  Positive for cough and shortness of breath.   Cardiovascular:  Negative for chest pain.  Gastrointestinal:  Negative for abdominal pain.  Genitourinary:  Negative for flank pain.  Musculoskeletal:  Negative for back pain.  Skin:  Negative for color change and rash.  Neurological:  Negative for syncope.  All other systems reviewed and are negative.   Physical Exam Updated Vital Signs BP (!) 133/96 (BP Location: Right Arm)   Pulse 86   Temp 98.7 F (37.1 C) (Oral)   Resp 17   Ht 6' (1.829 m)   Wt 81.6 kg   SpO2 100%   BMI 24.40 kg/m  Physical Exam Constitutional:      Appearance: He is well-developed.  HENT:     Head: Normocephalic.     Nose: Nose normal.  Eyes:     Extraocular Movements: Extraocular movements intact.  Cardiovascular:     Rate and Rhythm: Normal rate.  Pulmonary:     Effort: Pulmonary effort is normal.     Breath sounds: No wheezing.  Abdominal:     Tenderness: There is no abdominal tenderness. There is  no guarding or rebound.  Skin:    Coloration: Skin is not jaundiced.  Neurological:     Mental Status: He is alert. Mental status is at baseline.     ED Results / Procedures / Treatments   Labs (all labs ordered are listed, but only abnormal results are displayed) Labs Reviewed  SARS CORONAVIRUS 2 BY RT PCR - Abnormal; Notable for the following components:      Result Value   SARS Coronavirus 2 by RT PCR POSITIVE (*)    All other components within normal limits    EKG None  Radiology DG Chest Port 1 View  Result Date: 09/12/2021 CLINICAL DATA:  Flu like symptoms, fatigue, body aches and fever. EXAM: PORTABLE CHEST 1 VIEW COMPARISON:  01/08/2020. FINDINGS: Trachea is midline. Heart size  normal. Lungs are clear. No pleural fluid. IMPRESSION: Negative. Electronically Signed   By: Leanna Battles M.D.   On: 09/12/2021 15:43    Procedures Procedures    Medications Ordered in ED Medications - No data to display  ED Course/ Medical Decision Making/ A&P                           Medical Decision Making Amount and/or Complexity of Data Reviewed Radiology: ordered.   External record review shows visit May 2023 for rectal bleeding.  Patient's COVID test is positive for COVID.  Patient states he "does not do any medications."  Declines any additional antiviral medications.  Will be discharged home, advised outpatient follow-up with his doctor in 3 to 4 days via phone call.  Advised immediate return for difficulty breathing or worsening symptoms.        Final Clinical Impression(s) / ED Diagnoses Final diagnoses:  COVID-19 virus infection    Rx / DC Orders ED Discharge Orders     None         Cheryll Cockayne, MD 09/12/21 [phone]

## 2022-03-31 ENCOUNTER — Encounter: Payer: Self-pay | Admitting: Emergency Medicine

## 2022-03-31 ENCOUNTER — Ambulatory Visit
Admission: EM | Admit: 2022-03-31 | Discharge: 2022-03-31 | Disposition: A | Discharge status: Home / Self Care | Payer: Medicaid Other | Attending: Family Medicine | Admitting: Family Medicine

## 2022-03-31 DIAGNOSIS — R509 Fever, unspecified: Secondary | ICD-10-CM

## 2022-03-31 DIAGNOSIS — J069 Acute upper respiratory infection, unspecified: Secondary | ICD-10-CM

## 2022-03-31 DIAGNOSIS — J4521 Mild intermittent asthma with (acute) exacerbation: Secondary | ICD-10-CM

## 2022-03-31 MED ORDER — OSELTAMIVIR PHOSPHATE 75 MG PO CAPS: 75.0000 mg | ORAL_CAPSULE | Freq: Two times a day (BID) | ORAL | 0 refills | Status: DC

## 2022-03-31 MED ORDER — ALBUTEROL SULFATE HFA 108 (90 BASE) MCG/ACT IN AERS: 2.0000 | INHALATION_SPRAY | Freq: Four times a day (QID) | RESPIRATORY_TRACT | 0 refills | Status: DC | PRN

## 2022-03-31 MED ORDER — PROMETHAZINE-DM 6.25-15 MG/5ML PO SYRP: 5.0000 mL | ORAL_SOLUTION | Freq: Four times a day (QID) | ORAL | 0 refills | Status: DC | PRN

## 2022-03-31 NOTE — ED Provider Notes (Signed)
[facility]    CSN: [id number] Arrival date & time: 03/31/22  9024      History   Chief Complaint No chief complaint on file.   HPI Todd Randolph is a 41 y.o. male.   Patient presenting today with 3 days of cough, congestion, headache, fever, body aches, chest tightness, wheezing.  Denies chest pain, shortness of breath, abdominal pain, diarrhea.  Taking ibuprofen with minimal relief of symptoms.  No known sick contacts recently.  History of asthma not currently on any inhalers.    Past Medical History:  Diagnosis Date   Amyloidosis (HCC)    Asthma    Chronic headaches    Heart murmur     There are no problems to display for this patient.   Past Surgical History:  Procedure Laterality Date   ADENOIDECTOMY     TONSILLECTOMY         Home Medications    Prior to Admission medications   Medication Sig Start Date End Date Taking? Authorizing Provider  albuterol (VENTOLIN HFA) 108 (90 Base) MCG/ACT inhaler Inhale 2 puffs into the lungs every 6 (six) hours as needed for wheezing or shortness of breath. 03/31/22  Yes Particia Nearing, PA-C  oseltamivir (TAMIFLU) 75 MG capsule Take 1 capsule (75 mg total) by mouth every 12 (twelve) hours. 03/31/22  Yes Particia Nearing, PA-C  promethazine-dextromethorphan (PROMETHAZINE-DM) 6.25-15 MG/5ML syrup Take 5 mLs by mouth 4 (four) times daily as needed. 03/31/22  Yes Particia Nearing, PA-C  amoxicillin-clavulanate (AUGMENTIN) 875-125 MG tablet Take 1 tablet by mouth every 12 (twelve) hours. Patient not taking: Reported on 01/08/2020 11/06/19   Durward Parcel, FNP  cetirizine (ZYRTEC ALLERGY) 10 MG tablet Take 1 tablet (10 mg total) by mouth daily. 06/24/21   Theron Arista, PA-C  chlorhexidine (PERIDEX) 0.12 % solution Use as directed 15 mLs in the mouth or throat 2 (two) times daily. Patient not taking: Reported on 01/08/2020 11/06/19   Durward Parcel, FNP  cyclobenzaprine (FLEXERIL) 5 MG tablet Take  1 tablet (5 mg total) by mouth 3 (three) times daily as needed for muscle spasms. Patient not taking: Reported on 01/08/2020 10/16/18   Burgess Amor, PA-C  hydrocortisone (ANUSOL-HC) 2.5 % rectal cream Place 1 application. rectally 2 (two) times daily. 08/17/21   Triplett, Tammy, PA-C  ibuprofen (ADVIL) 800 MG tablet Take 1 tablet (800 mg total) by mouth 3 (three) times daily. 09/12/21   Cheryll Cockayne, MD  ondansetron (ZOFRAN) 4 MG tablet Take 1 tablet (4 mg total) by mouth every 6 (six) hours as needed for nausea or vomiting. 08/20/16   Loren Racer, MD  pantoprazole (PROTONIX) 20 MG tablet Take 1 tablet (20 mg total) by mouth daily. 01/08/20   Bethann Berkshire, MD  sucralfate (CARAFATE) 1 g tablet Take 1 tablet (1 g total) by mouth 3 (three) times daily as needed. Patient not taking: Reported on 01/08/2020 08/20/16   Loren Racer, MD    Family History Family History  Family history unknown: Yes    Social History Social History   Tobacco Use   Smoking status: Every Day    Packs/day: 0.50    Types: Cigarettes   Smokeless tobacco: Never  Vaping Use   Vaping Use: Never used  Substance Use Topics   Alcohol use: No   Drug use: Yes    Types: Marijuana    Comment: once a day     Allergies   Patient has no known allergies.  Review of Systems Review of Systems HPI  Physical Exam Triage Vital Signs ED Triage Vitals [03/31/22 0955]  Enc Vitals Group     BP 124/80     Pulse Rate 66     Resp 18     Temp 98.6 F (37 C)     Temp Source Oral     SpO2 97 %     Weight      Height      Head Circumference      Peak Flow      Pain Score 5     Pain Loc      Pain Edu?      Excl. in [place]?    No data found.  Updated Vital Signs BP 124/80 (BP Location: Right Arm)   Pulse 66   Temp 98.6 F (37 C) (Oral)   Resp 18   SpO2 97%   Visual Acuity Right Eye Distance:   Left Eye Distance:   Bilateral Distance:    Right Eye Near:   Left Eye Near:    Bilateral Near:      Physical Exam Vitals and nursing note reviewed.  Constitutional:      Appearance: He is well-developed.  HENT:     Head: Atraumatic.     Right Ear: External ear normal.     Left Ear: External ear normal.     Nose: Rhinorrhea present.     Mouth/Throat:     Mouth: Mucous membranes are moist.     Pharynx: Posterior oropharyngeal erythema present. No oropharyngeal exudate.  Eyes:     Conjunctiva/sclera: Conjunctivae normal.     Pupils: Pupils are equal, round, and reactive to light.  Cardiovascular:     Rate and Rhythm: Normal rate and regular rhythm.  Pulmonary:     Effort: Pulmonary effort is normal. No respiratory distress.     Breath sounds: Wheezing present. No rales.     Comments: Trace wheezes Musculoskeletal:        General: Normal range of motion.     Cervical back: Normal range of motion and neck supple.  Lymphadenopathy:     Cervical: No cervical adenopathy.  Skin:    General: Skin is warm and dry.  Neurological:     Mental Status: He is alert and oriented to person, place, and time.  Psychiatric:        Behavior: Behavior normal.      UC Treatments / Results  Labs (all labs ordered are listed, but only abnormal results are displayed) Labs Reviewed - No data to display  EKG   Radiology No results found.  Procedures Procedures (including critical care time)  Medications Ordered in UC Medications - No data to display  Initial Impression / Assessment and Plan / UC Course  I have reviewed the triage vital signs and the nursing notes.  Pertinent labs & imaging results that were available during my care of the patient were reviewed by me and considered in my medical decision making (see chart for details).     Vitals and exam overall reassuring and suggestive of a viral respiratory infection, likely influenza causing asthma exacerbation.  Treat with Tamiflu, albuterol, Phenergan DM, supportive over-the-counter medications and home care.  Work note  given.  Return for worsening symptoms.  Final Clinical Impressions(s) / UC Diagnoses   Final diagnoses:  Viral URI with cough  Fever, unspecified  Mild intermittent asthma with acute exacerbation   Discharge Instructions   None    ED  Prescriptions     Medication Sig Dispense Auth. Provider   albuterol (VENTOLIN HFA) 108 (90 Base) MCG/ACT inhaler Inhale 2 puffs into the lungs every 6 (six) hours as needed for wheezing or shortness of breath. 18 g Roosvelt Maser [place], [place]   promethazine-dextromethorphan (PROMETHAZINE-DM) 6.25-15 MG/5ML syrup Take 5 mLs by mouth 4 (four) times daily as needed. 100 mL Particia Nearing, [place]   oseltamivir (TAMIFLU) 75 MG capsule Take 1 capsule (75 mg total) by mouth every 12 (twelve) hours. 10 capsule Particia Nearing, [place]      PDMP not reviewed this encounter.   Particia Nearing, [place] 03/31/22 1049

## 2022-03-31 NOTE — ED Triage Notes (Signed)
Cough and chest congestion, headache, body aches, vomiting since Tuesday.

## 2022-06-08 ENCOUNTER — Ambulatory Visit
Admission: EM | Admit: 2022-06-08 | Discharge: 2022-06-08 | Disposition: A | Discharge status: Home / Self Care | Payer: Medicaid Other

## 2022-06-08 ENCOUNTER — Other Ambulatory Visit: Payer: Self-pay

## 2022-06-08 ENCOUNTER — Emergency Department (HOSPITAL_COMMUNITY)
Admission: EM | Admit: 2022-06-08 | Discharge: 2022-06-08 | Disposition: A | Discharge status: Home / Self Care | Payer: Medicaid Other | Attending: Emergency Medicine | Admitting: Emergency Medicine

## 2022-06-08 DIAGNOSIS — K920 Hematemesis: Secondary | ICD-10-CM

## 2022-06-08 DIAGNOSIS — K226 Gastro-esophageal laceration-hemorrhage syndrome: Secondary | ICD-10-CM | POA: Insufficient documentation

## 2022-06-08 LAB — COMPREHENSIVE METABOLIC PANEL
ALT: 13 U/L (ref 0–44)
AST: 24 U/L (ref 15–41)
Albumin: 3.8 g/dL (ref 3.5–5.0)
Alkaline Phosphatase: 38 U/L (ref 38–126)
Anion gap: 7 (ref 5–15)
BUN: 11 mg/dL (ref 6–20)
CO2: 25 mmol/L (ref 22–32)
Calcium: 8.9 mg/dL (ref 8.9–10.3)
Chloride: 104 mmol/L (ref 98–111)
Creatinine, Ser: 1.13 mg/dL (ref 0.61–1.24)
GFR, Estimated: >60 mL/min (ref >60)
Glucose, Bld: 109 mg/dL — ABNORMAL HIGH (ref 70–99)
Potassium: 3.6 mmol/L (ref 3.5–5.1)
Sodium: 136 mmol/L (ref 135–145)
Total Bilirubin: 0.8 mg/dL (ref 0.3–1.2)
Total Protein: 7 g/dL (ref 6.5–8.1)

## 2022-06-08 LAB — CBC
HCT: 41.6 % (ref 39.0–52.0)
Hemoglobin: 13.7 g/dL (ref 13.0–17.0)
MCH: 29.5 pg (ref 26.0–34.0)
MCHC: 32.9 g/dL (ref 30.0–36.0)
MCV: 89.7 fL (ref 80.0–100.0)
Platelets: 200 10*3/uL (ref 150–400)
RBC: 4.64 MIL/uL (ref 4.22–5.81)
RDW: 13.7 % (ref 11.5–15.5)
WBC: 5.7 10*3/uL (ref 4.0–10.5)
nRBC: 0 % (ref 0.0–0.2)

## 2022-06-08 LAB — TYPE AND SCREEN
ABO/RH(D): O POS
Antibody Screen: NEGATIVE

## 2022-06-08 MED ORDER — PANTOPRAZOLE SODIUM 20 MG PO TBEC: 20.0000 mg | DELAYED_RELEASE_TABLET | Freq: Every day | ORAL | 0 refills | Status: DC

## 2022-06-08 MED ORDER — PANTOPRAZOLE SODIUM 40 MG PO TBEC
40.0000 mg | DELAYED_RELEASE_TABLET | Freq: Once | ORAL | Status: AC
  Administered 2022-06-08: 40 mg via ORAL
  Filled 2022-06-08: qty 1

## 2022-06-08 MED ORDER — ONDANSETRON HCL 4 MG/2ML IJ SOLN
4.0000 mg | Freq: Once | INTRAMUSCULAR | Status: AC
  Administered 2022-06-08: 4 mg via INTRAVENOUS
  Filled 2022-06-08: qty 2

## 2022-06-08 MED ORDER — ONDANSETRON 4 MG PO TBDP: 4.0000 mg | ORAL_TABLET | Freq: Three times a day (TID) | ORAL | 0 refills | Status: DC | PRN

## 2022-06-08 NOTE — ED Provider Notes (Signed)
[facility] Provider Note   CSN: [id number] Arrival date & time: 06/08/22  9983     History  Chief Complaint  Patient presents with   Hematemesis    Todd Randolph is a 42 y.o. male.  42 year old male with a history of chronic vomiting and marijuana use who presents emergency department with hematemesis.  Says that he has a longstanding history of vomiting in the mornings.  Says that today he had multiple episodes of emesis and noticed after his first 1 that he was starting to have blood-streaked emesis.  Denies any significant abdominal pain.  Has been using more ibuprofen than usual recently.  No significant alcohol use or history of ulcers.  Smokes marijuana every other day.  Denies any headaches in the morning or other concerning symptoms.  Not on any anticoagulation.  No melena or hematochezia.       Home Medications Prior to Admission medications   Medication Sig Start Date End Date Taking? Authorizing Provider  albuterol (VENTOLIN HFA) 108 (90 Base) MCG/ACT inhaler Inhale 2 puffs into the lungs every 6 (six) hours as needed for wheezing or shortness of breath. 03/31/22  Yes Volney American, PA-C  hydrocortisone (ANUSOL-HC) 2.5 % rectal cream Place 1 application. rectally 2 (two) times daily. 08/17/21  Yes Triplett, Tammy, PA-C  ibuprofen (ADVIL) 800 MG tablet Take 1 tablet (800 mg total) by mouth 3 (three) times daily. 09/12/21  Yes Luna Fuse, MD  ondansetron (ZOFRAN-ODT) 4 MG disintegrating tablet Take 1 tablet (4 mg total) by mouth every 8 (eight) hours as needed for nausea or vomiting. 06/08/22  Yes Fransico Meadow, MD  amoxicillin-clavulanate (AUGMENTIN) 875-125 MG tablet Take 1 tablet by mouth every 12 (twelve) hours. Patient not taking: Reported on 01/08/2020 11/06/19   Emerson Monte, FNP  cetirizine (ZYRTEC ALLERGY) 10 MG tablet Take 1 tablet (10 mg total) by mouth daily. Patient not taking: Reported on 06/08/2022  06/24/21   Sherrill Raring, PA-C  chlorhexidine (PERIDEX) 0.12 % solution Use as directed 15 mLs in the mouth or throat 2 (two) times daily. Patient not taking: Reported on 01/08/2020 11/06/19   Emerson Monte, FNP  cyclobenzaprine (FLEXERIL) 5 MG tablet Take 1 tablet (5 mg total) by mouth 3 (three) times daily as needed for muscle spasms. Patient not taking: Reported on 01/08/2020 10/16/18   Evalee Jefferson, PA-C  ondansetron (ZOFRAN) 4 MG tablet Take 1 tablet (4 mg total) by mouth every 6 (six) hours as needed for nausea or vomiting. Patient not taking: Reported on 06/08/2022 08/20/16   Julianne Rice, MD  oseltamivir (TAMIFLU) 75 MG capsule Take 1 capsule (75 mg total) by mouth every 12 (twelve) hours. Patient not taking: Reported on 06/08/2022 03/31/22   Volney American, PA-C  pantoprazole (PROTONIX) 20 MG tablet Take 1 tablet (20 mg total) by mouth daily for 14 days. 06/08/22 06/22/22  Fransico Meadow, MD  promethazine-dextromethorphan (PROMETHAZINE-DM) 6.25-15 MG/5ML syrup Take 5 mLs by mouth 4 (four) times daily as needed. Patient not taking: Reported on 06/08/2022 03/31/22   Volney American, PA-C  sucralfate (CARAFATE) 1 g tablet Take 1 tablet (1 g total) by mouth 3 (three) times daily as needed. Patient not taking: Reported on 01/08/2020 08/20/16   Julianne Rice, MD      Allergies    Patient has no known allergies.    Review of Systems   Review of Systems  Physical Exam Updated Vital Signs BP 111/73  Pulse 88   Temp 98.2 F (36.8 C) (Oral)   Resp 19   Ht 6' (1.829 m)   Wt 79.4 kg   SpO2 98%   BMI 23.73 kg/m  Physical Exam Vitals and nursing note reviewed.  Constitutional:      General: He is not in acute distress.    Appearance: He is well-developed.  HENT:     Head: Normocephalic and atraumatic.     Right Ear: External ear normal.     Left Ear: External ear normal.     Nose: Nose normal.  Eyes:     Extraocular Movements: Extraocular movements intact.      Conjunctiva/sclera: Conjunctivae normal.     Pupils: Pupils are equal, round, and reactive to light.  Cardiovascular:     Rate and Rhythm: Normal rate and regular rhythm.  Pulmonary:     Effort: Pulmonary effort is normal. No respiratory distress.  Abdominal:     General: There is no distension.     Palpations: Abdomen is soft. There is no mass.     Tenderness: There is no abdominal tenderness. There is no guarding.  Musculoskeletal:     Cervical back: Normal range of motion and neck supple.  Skin:    General: Skin is warm and dry.  Neurological:     Mental Status: He is alert. Mental status is at baseline.  Psychiatric:        Mood and Affect: Mood normal.        Behavior: Behavior normal.     ED Results / Procedures / Treatments   Labs (all labs ordered are listed, but only abnormal results are displayed) Labs Reviewed  COMPREHENSIVE METABOLIC PANEL - Abnormal; Notable for the following components:      Result Value   Glucose, Bld 109 (*)    All other components within normal limits  CBC  TYPE AND SCREEN    EKG None  Radiology No results found.  Procedures Procedures   Medications Ordered in ED Medications  ondansetron (ZOFRAN) injection 4 mg (4 mg Intravenous Given 06/08/22 1154)  pantoprazole (PROTONIX) EC tablet 40 mg (40 mg Oral Given 06/08/22 1154)    ED Course/ Medical Decision Making/ A&P                             Medical Decision Making Amount and/or Complexity of Data Reviewed Labs: ordered.  Risk Prescription drug management.   [name] is a 42 y.o. male with comorbidities that complicate the patient evaluation including chronic nausea and vomiting and marijuana use who presents emergency department with hematemesis  Initial Ddx:  Mallory-Weiss tear, gastroenteritis, GI bleed, cannabinoid hyperemesis  MDM:  Feel the patient likely has Mallory-Weiss tear in the setting of his chronic vomiting.  May be related to cannabinoid  hyperemesis.  Will obtain lab work but feel that GI bleed is less likely since he is not having melena or hematochezia.  No infectious symptoms to suggest gastroenteritis.  No headaches that would suggest brain tumor or malignancy.  Plan:  Labs Type and screen Zofran  ED Summary/Re-evaluation:  Patient reassessed and was stable in the emergency department.  No repeat episodes of vomiting and his nausea and vomiting was controlled with Zofran.  Labs revealed normal hemoglobin and patient was discharged home with antacids and Zofran.  Instructed to follow-up with GI and his primary doctor regarding his symptoms.  This patient presents to the ED for concern  of complaints listed in HPI, this involves an extensive number of treatment options, and is a complaint that carries with it a high risk of complications and morbidity. Disposition including potential need for admission considered.   Dispo: DC Home. Return precautions discussed including, but not limited to, those listed in the AVS. Allowed pt time to ask questions which were answered fully prior to dc.  Additional history obtained from significant other Records reviewed Outpatient Clinic Notes The following labs were independently interpreted: CBC and show no acute abnormality I personally reviewed and interpreted cardiac monitoring: normal sinus rhythm  I personally reviewed and interpreted the pt's EKG: see above for interpretation  I have reviewed the patients home medications and made adjustments as needed Social Determinants of health:  Marijuana use   Final Clinical Impression(s) / ED Diagnoses Final diagnoses:  Mallory-Weiss tear  Hematemesis with nausea    Rx / DC Orders ED Discharge Orders          Ordered    ondansetron (ZOFRAN-ODT) 4 MG disintegrating tablet  Every 8 hours PRN        06/08/22 1250    pantoprazole (PROTONIX) 20 MG tablet  Daily        06/08/22 1250              Fransico Meadow, MD 06/09/22  1144

## 2022-06-08 NOTE — ED Notes (Signed)
Pt states he was told he had gastritis w/ bleeding and told to not take NSAIDs. Pt states that he has had continues episodes of vomiting for years and that he eats hot sauce on everything. Pt states he last ate fish with hot sauce and took 800 mg ibuprofen to help with pain and has been vomiting blood since.

## 2022-06-08 NOTE — Discharge Instructions (Addendum)
You were seen for bloody vomit in the emergency department.  It is likely caused by a Mallory-Weiss tear.  At home, please take the Zofran as needed for any nausea or vomiting that you may have.  Please refrain from ibuprofen and other NSAIDs.  Take the antiacid we have prescribed you.    Check your MyChart online for the results of any tests that had not resulted by the time you left the emergency department.   Follow-up with your primary doctor in 2-3 days regarding your visit.  Follow-up with the gastroenterologist about your nausea and vomiting.  Return immediately to the emergency department if you experience any of the following: Severe abdominal pain or chest pain, recurrent vomiting despite the medications above, or any other concerning symptoms.    Thank you for visiting our Emergency Department. It was a pleasure taking care of you today.

## 2022-06-08 NOTE — ED Triage Notes (Signed)
Pt states he starting vomiting blood today, reports he usually throws up every morning but noticed bright red blood. Hx of GERD. Denies taking any blood thinners.

## 2022-06-08 NOTE — ED Provider Notes (Signed)
Patient presented to urgent care today for vomiting blood.  Reports has been ongoing for the past 2 years.  He appears stable.  Unable to fully evaluate vomiting blood in urgent care setting, recommended patient go to emergency room for further evaluation and management.  Patient is in agreement with plan.  Patient safe to transport via private vehicle at this time.   Eulogio Bear, NP 06/08/22 [phone]

## 2022-06-08 NOTE — ED Notes (Signed)
Pt went to the ED as pt needs imagining as he is being vomiting blood x 2 years.

## 2022-08-19 ENCOUNTER — Emergency Department (HOSPITAL_COMMUNITY)
Admission: EM | Admit: 2022-08-19 | Discharge: 2022-08-19 | Disposition: A | Discharge status: Home / Self Care | Payer: No Typology Code available for payment source | Attending: Emergency Medicine | Admitting: Emergency Medicine

## 2022-08-19 ENCOUNTER — Encounter (HOSPITAL_COMMUNITY): Payer: Self-pay

## 2022-08-19 ENCOUNTER — Other Ambulatory Visit: Payer: Self-pay

## 2022-08-19 DIAGNOSIS — Z5321 Procedure and treatment not carried out due to patient leaving prior to being seen by health care provider: Secondary | ICD-10-CM | POA: Diagnosis not present

## 2022-08-19 DIAGNOSIS — R1110 Vomiting, unspecified: Secondary | ICD-10-CM | POA: Insufficient documentation

## 2022-08-19 LAB — CBC
HCT: 42.7 % (ref 39.0–52.0)
Hemoglobin: 13.9 g/dL (ref 13.0–17.0)
MCH: 29.4 pg (ref 26.0–34.0)
MCHC: 32.6 g/dL (ref 30.0–36.0)
MCV: 90.5 fL (ref 80.0–100.0)
Platelets: 182 10*3/uL (ref 150–400)
RBC: 4.72 MIL/uL (ref 4.22–5.81)
RDW: 13.8 % (ref 11.5–15.5)
WBC: 5.2 10*3/uL (ref 4.0–10.5)
nRBC: 0 % (ref 0.0–0.2)

## 2022-08-19 LAB — COMPREHENSIVE METABOLIC PANEL
ALT: 12 U/L (ref 0–44)
AST: 20 U/L (ref 15–41)
Albumin: 4 g/dL (ref 3.5–5.0)
Alkaline Phosphatase: 39 U/L (ref 38–126)
Anion gap: 7 (ref 5–15)
BUN: 11 mg/dL (ref 6–20)
CO2: 26 mmol/L (ref 22–32)
Calcium: 8.9 mg/dL (ref 8.9–10.3)
Chloride: 103 mmol/L (ref 98–111)
Creatinine, Ser: 1.16 mg/dL (ref 0.61–1.24)
GFR, Estimated: >60 mL/min (ref >60)
Glucose, Bld: 95 mg/dL (ref 70–99)
Potassium: 4 mmol/L (ref 3.5–5.1)
Sodium: 136 mmol/L (ref 135–145)
Total Bilirubin: 0.8 mg/dL (ref 0.3–1.2)
Total Protein: 7.2 g/dL (ref 6.5–8.1)

## 2022-08-19 LAB — LIPASE, BLOOD: Lipase: 29 U/L (ref 11–51)

## 2022-08-19 NOTE — ED Notes (Signed)
Unable to find pt to bring to a room, called 3x.

## 2022-08-19 NOTE — ED Triage Notes (Signed)
Pt reports vomiting for the past three days. Denies any diarrhea.

## 2023-01-02 IMAGING — DX DG CHEST 1V PORT
1 series · 1 of 1 positions shown · non-contrast
Comparison: 01/08/2020.

CLINICAL DATA: Flu like symptoms, fatigue, body aches and fever.

EXAM:
PORTABLE CHEST 1 VIEW

[chest ap]
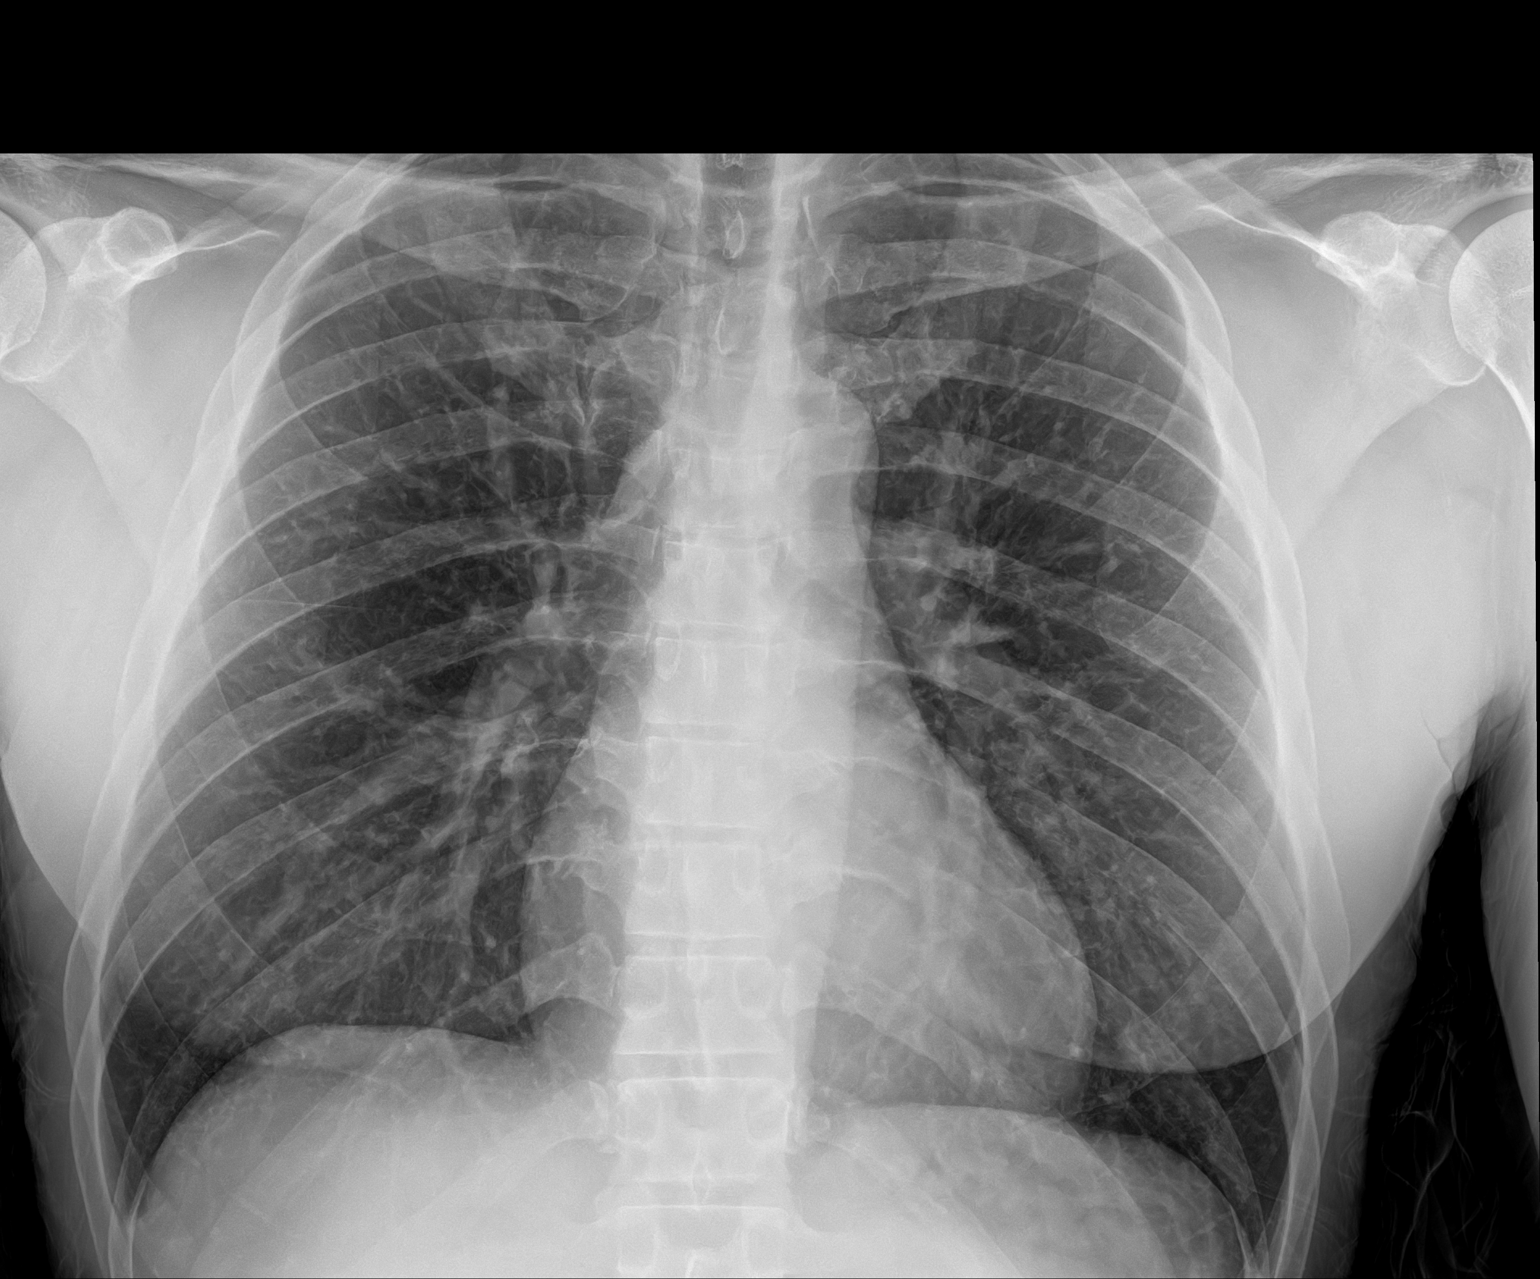

[1 of 1 positions shown; findings below may reference images not displayed]

FINDINGS: Trachea is midline. Heart size normal. Lungs are clear. No pleural
fluid.
IMPRESSION: Negative.

## 2023-04-25 ENCOUNTER — Encounter (HOSPITAL_COMMUNITY): Payer: Self-pay | Admitting: *Deleted

## 2023-04-25 ENCOUNTER — Other Ambulatory Visit: Payer: Self-pay

## 2023-04-25 ENCOUNTER — Emergency Department (HOSPITAL_COMMUNITY)
Admission: EM | Admit: 2023-04-25 | Discharge: 2023-04-25 | Disposition: A | Discharge status: Home / Self Care | Payer: No Typology Code available for payment source | Attending: Emergency Medicine | Admitting: Emergency Medicine

## 2023-04-25 DIAGNOSIS — R040 Epistaxis: Secondary | ICD-10-CM | POA: Diagnosis present

## 2023-04-25 DIAGNOSIS — R0981 Nasal congestion: Secondary | ICD-10-CM | POA: Diagnosis not present

## 2023-04-25 DIAGNOSIS — Z79899 Other long term (current) drug therapy: Secondary | ICD-10-CM | POA: Diagnosis not present

## 2023-04-25 DIAGNOSIS — R42 Dizziness and giddiness: Secondary | ICD-10-CM | POA: Diagnosis not present

## 2023-04-25 NOTE — ED Triage Notes (Signed)
The pt had a nose bleed this am  none since then  he wants to be checked because it has been a long time since he had a nosebleed

## 2023-04-25 NOTE — Discharge Instructions (Signed)
If your nose rebleeds please hold pressure for 20 minutes without checking.  If it continues to bleed return to the emergency department.  You can try to humidify the air with a humidifier which tends to lessen nosebleeds.  You can also use the Flonase.  Return to the emergency department if any worsening or concerning symptoms

## 2023-04-25 NOTE — ED Notes (Signed)
MD at bedside. 

## 2023-04-25 NOTE — ED Provider Notes (Signed)
[place] EMERGENCY DEPARTMENT AT [facility] Provider Note   CSN: [id number] Arrival date & time: 04/25/23  1944     History {Add pertinent medical, surgical, social history, OB history to HPI:1} Chief Complaint  Patient presents with   Epistaxis   note for work    Noel Thormahlen is a 43 y.o. male.  He is here with a complaint of nosebleed out of his left nostril that started earlier today.  He said lasted about an hour.  He felt a Sisney dizzy while was going on but he feels back to baseline now.  He is not on any blood thinner.  He said he has chronic nasal congestion and sinus problems.  No fever no trauma.  The history is provided by the patient.  Epistaxis Location:  L nare Severity:  Moderate Duration:  60 minutes Timing:  Constant Progression:  Resolved Chronicity:  New Context: not anticoagulants   Relieved by:  Applying pressure Associated symptoms: congestion, dizziness and sinus pain        Home Medications Prior to Admission medications   Medication Sig Start Date End Date Taking? Authorizing Provider  albuterol (VENTOLIN HFA) 108 (90 Base) MCG/ACT inhaler Inhale 2 puffs into the lungs every 6 (six) hours as needed for wheezing or shortness of breath. 03/31/22   Particia Nearing, PA-C  amoxicillin-clavulanate (AUGMENTIN) 875-125 MG tablet Take 1 tablet by mouth every 12 (twelve) hours. Patient not taking: Reported on 01/08/2020 11/06/19   Durward Parcel, FNP  cetirizine (ZYRTEC ALLERGY) 10 MG tablet Take 1 tablet (10 mg total) by mouth daily. Patient not taking: Reported on 06/08/2022 06/24/21   Theron Arista, PA-C  chlorhexidine (PERIDEX) 0.12 % solution Use as directed 15 mLs in the mouth or throat 2 (two) times daily. Patient not taking: Reported on 01/08/2020 11/06/19   Durward Parcel, FNP  cyclobenzaprine (FLEXERIL) 5 MG tablet Take 1 tablet (5 mg total) by mouth 3 (three) times daily as needed for muscle spasms. Patient not taking: Reported  on 01/08/2020 10/16/18   Burgess Amor, PA-C  hydrocortisone (ANUSOL-HC) 2.5 % rectal cream Place 1 application. rectally 2 (two) times daily. 08/17/21   Triplett, Tammy, PA-C  ibuprofen (ADVIL) 800 MG tablet Take 1 tablet (800 mg total) by mouth 3 (three) times daily. 09/12/21   Cheryll Cockayne, MD  ondansetron (ZOFRAN) 4 MG tablet Take 1 tablet (4 mg total) by mouth every 6 (six) hours as needed for nausea or vomiting. Patient not taking: Reported on 06/08/2022 08/20/16   Loren Racer, MD  ondansetron (ZOFRAN-ODT) 4 MG disintegrating tablet Take 1 tablet (4 mg total) by mouth every 8 (eight) hours as needed for nausea or vomiting. 06/08/22   Rondel Baton, MD  oseltamivir (TAMIFLU) 75 MG capsule Take 1 capsule (75 mg total) by mouth every 12 (twelve) hours. Patient not taking: Reported on 06/08/2022 03/31/22   Particia Nearing, PA-C  pantoprazole (PROTONIX) 20 MG tablet Take 1 tablet (20 mg total) by mouth daily for 14 days. 06/08/22 06/22/22  Rondel Baton, MD  promethazine-dextromethorphan (PROMETHAZINE-DM) 6.25-15 MG/5ML syrup Take 5 mLs by mouth 4 (four) times daily as needed. Patient not taking: Reported on 06/08/2022 03/31/22   Particia Nearing, PA-C  sucralfate (CARAFATE) 1 g tablet Take 1 tablet (1 g total) by mouth 3 (three) times daily as needed. Patient not taking: Reported on 01/08/2020 08/20/16   Loren Racer, MD      Allergies    Patient has no  known allergies.    Review of Systems   Review of Systems  HENT:  Positive for congestion, nosebleeds and sinus pain.   Neurological:  Positive for dizziness.    Physical Exam Updated Vital Signs BP 125/83 (BP Location: Right Arm)   Pulse 77   Temp 98 F (36.7 C)   Resp 16   Ht 6' (1.829 m)   Wt 81.6 kg   SpO2 100%   BMI 24.40 kg/m  Physical Exam Vitals and nursing note reviewed.  Constitutional:      Appearance: Normal appearance. He is well-developed.  HENT:     Head: Normocephalic and atraumatic.     Nose:  Congestion present. No rhinorrhea.     Comments: No septal hematoma.  Nasal turbinates are engorged but there is no active bleeding and no recent stigmata of bleeding    Mouth/Throat:     Mouth: Mucous membranes are moist.     Pharynx: Oropharynx is clear.  Eyes:     Conjunctiva/sclera: Conjunctivae normal.  Pulmonary:     Effort: Pulmonary effort is normal.  Musculoskeletal:     Cervical back: Neck supple.  Skin:    General: Skin is warm and dry.  Neurological:     General: No focal deficit present.     Mental Status: He is alert.     GCS: GCS eye subscore is 4. GCS verbal subscore is 5. GCS motor subscore is 6.     ED Results / Procedures / Treatments   Labs (all labs ordered are listed, but only abnormal results are displayed) Labs Reviewed - No data to display  EKG None  Radiology No results found.  Procedures Procedures  {Document cardiac monitor, telemetry assessment procedure when appropriate:1}  Medications Ordered in ED Medications - No data to display  ED Course/ Medical Decision Making/ A&P   {   Click here for ABCD2, HEART and other calculatorsREFRESH Note before signing :1}                              Medical Decision Making  This patient complains of ***; this involves an extensive number of treatment Options and is a complaint that carries with it a high risk of complications and morbidity. The differential includes ***  I ordered, reviewed and interpreted labs, which included *** I ordered medication *** and reviewed PMP when indicated. I ordered imaging studies which included *** and I independently    visualized and interpreted imaging which showed *** Additional history obtained from *** Previous records obtained and reviewed *** I consulted *** and discussed lab and imaging findings and discussed disposition.  Cardiac monitoring reviewed, *** Social determinants considered, *** Critical Interventions: ***  After the interventions stated  above, I reevaluated the patient and found *** Admission and further testing considered, ***   {Document critical care time when appropriate:1} {Document review of labs and clinical decision tools ie heart score, Chads2Vasc2 etc:1}  {Document your independent review of radiology images, and any outside records:1} {Document your discussion with family members, caretakers, and with consultants:1} {Document social determinants of health affecting pt's care:1} {Document your decision making why or why not admission, treatments were needed:1} Final Clinical Impression(s) / ED Diagnoses Final diagnoses:  None    Rx / DC Orders ED Discharge Orders     None

## 2023-11-09 ENCOUNTER — Ambulatory Visit (HOSPITAL_COMMUNITY)
Admission: EM | Admit: 2023-11-09 | Discharge: 2023-11-09 | Disposition: A | Discharge status: Home / Self Care | Payer: Self-pay | Attending: Physician Assistant | Admitting: Physician Assistant

## 2023-11-09 ENCOUNTER — Encounter (HOSPITAL_COMMUNITY): Payer: Self-pay

## 2023-11-09 DIAGNOSIS — R112 Nausea with vomiting, unspecified: Secondary | ICD-10-CM

## 2023-11-09 DIAGNOSIS — K219 Gastro-esophageal reflux disease without esophagitis: Secondary | ICD-10-CM

## 2023-11-09 MED ORDER — FAMOTIDINE 20 MG PO TABS: 20.0000 mg | ORAL_TABLET | Freq: Every day | ORAL | 0 refills | Status: DC

## 2023-11-09 NOTE — ED Provider Notes (Signed)
 [facility]    CSN: [id number] Arrival date & time: 11/09/23  1512      History   Chief Complaint Chief Complaint  Patient presents with   Emesis    HPI Todd Randolph is a 43 y.o. male.   Patient presents today for evaluation of an episode of emesis that occurred earlier today.  Reports that he was at work when he had severe nausea with an episode of emesis.  Denies any additional symptoms including fever, abdominal pain, hematemesis, diarrhea, melena, hematochezia.  He was sent home from work and they requested that he be evaluated prior to returning.  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  He has not tried any over-the-counter medications for symptom management.  He denies any suspicious food intake, medication changes, antibiotic use, recent travel.  Denies any history of abdominal surgery including ulcerative colitis or Crohn's disease.  He does have a history of acid reflux and believes that this might of triggered his symptoms.  Previously he is taking Pepcid  but has not had this in several weeks.  He reports that he is no longer feeling nauseous and has been able to eat and drink following that episode without recurrence of symptoms.  His last bowel movement was earlier today and was normal.    Past Medical History:  Diagnosis Date   Amyloidosis (HCC)    Asthma    Chronic headaches    Heart murmur     There are no active problems to display for this patient.   Past Surgical History:  Procedure Laterality Date   ADENOIDECTOMY     TONSILLECTOMY         Home Medications    Prior to Admission medications   Medication Sig Start Date End Date Taking? Authorizing Provider  famotidine  (PEPCID ) 20 MG tablet Take 1 tablet (20 mg total) by mouth at bedtime. 11/09/23  Yes Jillayne Witte, Rocky POUR, PA-C    Family History Family History  Family history unknown: Yes    Social History Social History   Tobacco Use    Smoking status: Every Day    Current packs/day: 0.50    Types: Cigarettes   Smokeless tobacco: Never  Vaping Use   Vaping status: Never Used  Substance Use Topics   Alcohol use: No   Drug use: Yes    Types: Marijuana    Comment: once a day     Allergies   Patient has no known allergies.   Review of Systems Review of Systems  Constitutional:  Negative for activity change, appetite change, fatigue and fever.  Respiratory:  Negative for shortness of breath.   Cardiovascular:  Negative for chest pain.  Gastrointestinal:  Positive for nausea and vomiting. Negative for abdominal pain, blood in stool, constipation and diarrhea.  Neurological:  Negative for dizziness, weakness, light-headedness and headaches.     Physical Exam Triage Vital Signs ED Triage Vitals  Encounter Vitals Group     BP 11/09/23 1545 112/75     Girls Systolic BP Percentile --      Girls Diastolic BP Percentile --      Boys Systolic BP Percentile --      Boys Diastolic BP Percentile --      Pulse Rate 11/09/23 1545 87     Resp 11/09/23 1545 16     Temp 11/09/23 1545 98.3 F (36.8 C)     Temp Source 11/09/23 1545 Oral  SpO2 11/09/23 1545 96 %     Weight 11/09/23 1545 176 lb (79.8 kg)     Height 11/09/23 1545 6' (1.829 m)     Head Circumference --      Peak Flow --      Pain Score 11/09/23 1544 0     Pain Loc --      Pain Education --      Exclude from Growth Chart --    No data found.  Updated Vital Signs BP 112/75 (BP Location: Right Arm)   Pulse 87   Temp 98.3 F (36.8 C) (Oral)   Resp 16   Ht 6' (1.829 m)   Wt 176 lb (79.8 kg)   SpO2 96%   BMI 23.87 kg/m   Visual Acuity Right Eye Distance:   Left Eye Distance:   Bilateral Distance:    Right Eye Near:   Left Eye Near:    Bilateral Near:     Physical Exam Vitals reviewed.  Constitutional:      General: He is awake.     Appearance: Normal appearance. He is well-developed. He is not ill-appearing.     Comments: Very  pleasant male appears stated age in no acute distress sitting comfortably in exam room  HENT:     Head: Normocephalic and atraumatic.     Mouth/Throat:     Pharynx: Uvula midline. No oropharyngeal exudate or posterior oropharyngeal erythema.  Cardiovascular:     Rate and Rhythm: Normal rate and regular rhythm.     Heart sounds: Normal heart sounds, S1 normal and S2 normal. No murmur heard. Pulmonary:     Effort: Pulmonary effort is normal.     Breath sounds: Normal breath sounds. No stridor. No wheezing, rhonchi or rales.  Abdominal:     General: Bowel sounds are normal.     Palpations: Abdomen is soft.     Tenderness: There is no abdominal tenderness. There is no right CVA tenderness, left CVA tenderness, guarding or rebound.     Comments: Benign abdominal exam  Neurological:     Mental Status: He is alert.  Psychiatric:        Behavior: Behavior is cooperative.      UC Treatments / Results  Labs (all labs ordered are listed, but only abnormal results are displayed) Labs Reviewed - No data to display  EKG   Radiology No results found.  Procedures Procedures (including critical care time)  Medications Ordered in UC Medications - No data to display  Initial Impression / Assessment and Plan / UC Course  I have reviewed the triage vital signs and the nursing notes.  Pertinent labs & imaging results that were available during my care of the patient were reviewed by me and considered in my medical decision making (see chart for details).     Very pleasant male appears stated age in no acute distress sitting comfortably in exam room.  Vital signs and physical exam are reassuring with no indication for emergent evaluation or imaging.  He is currently feeling well and has been eating and drinking normally since symptoms began.  Suspect symptoms are related to either mild viral illness or GERD.  Will restart Pepcid  to help manage his symptoms.  Offered prescription for Zofran   but reports that he is currently feeling well and does not require this today.  He was encouraged to use dietary lifestyle modification for additional symptom relief.  We discussed that if he has any worsening symptoms any to be  seen immediately including severe abdominal pain, recurrent nausea/vomiting interfering with oral intake, melena, hematochezia, hematemesis, weakness.  Strict return precautions given.  Excuse note provided.  Final Clinical Impressions(s) / UC Diagnoses   Final diagnoses:  Nausea and vomiting, unspecified vomiting type  Gastroesophageal reflux disease without esophagitis     Discharge Instructions      I am glad that you are feeling better now.  Start the Pepcid  every night to ensure that acid reflux is not contributing to your symptoms.  Eat a bland diet and avoid spicy/acidic/fatty foods.  Make sure you are drinking plenty of fluid.  Avoid alcohol.  Avoid NSAIDs including aspirin, ibuprofen /Advil , naproxen/Aleve.  If you have recurrent vomiting or any worsening symptoms including abdominal pain, blood in your stool, black stool, fever, weakness you need to be seen immediately.     ED Prescriptions     Medication Sig Dispense Auth. Provider   famotidine  (PEPCID ) 20 MG tablet Take 1 tablet (20 mg total) by mouth at bedtime. 30 tablet Nash Bolls K, PA-C      PDMP not reviewed this encounter.   Sherrell Rocky POUR, PA-C 11/09/23 1614

## 2023-11-09 NOTE — ED Triage Notes (Signed)
 Chief Complaint: emesis. Denies any fever or diarrhea.   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   Onset: today   Prescriptions or OTC medications tried: No    New foods, medications, or products: No  Recent Travel: No

## 2023-11-09 NOTE — Discharge Instructions (Signed)
 I am glad that you are feeling better now.  Start the Pepcid  every night to ensure that acid reflux is not contributing to your symptoms.  Eat a bland diet and avoid spicy/acidic/fatty foods.  Make sure you are drinking plenty of fluid.  Avoid alcohol.  Avoid NSAIDs including aspirin, ibuprofen /Advil , naproxen/Aleve.  If you have recurrent vomiting or any worsening symptoms including abdominal pain, blood in your stool, black stool, fever, weakness you need to be seen immediately.

## 2024-03-14 ENCOUNTER — Other Ambulatory Visit: Payer: Self-pay

## 2024-03-14 ENCOUNTER — Emergency Department (HOSPITAL_COMMUNITY)
Admission: EM | Admit: 2024-03-14 | Discharge: 2024-03-14 | Disposition: A | Discharge status: Home / Self Care | Payer: Self-pay

## 2024-03-14 ENCOUNTER — Encounter (HOSPITAL_COMMUNITY): Payer: Self-pay

## 2024-03-14 DIAGNOSIS — Z5321 Procedure and treatment not carried out due to patient leaving prior to being seen by health care provider: Secondary | ICD-10-CM | POA: Insufficient documentation

## 2024-03-14 DIAGNOSIS — R55 Syncope and collapse: Secondary | ICD-10-CM | POA: Insufficient documentation

## 2024-03-14 LAB — URINALYSIS, ROUTINE W REFLEX MICROSCOPIC
Bilirubin Urine: NEGATIVE
Glucose, UA: NEGATIVE mg/dL
Hgb urine dipstick: NEGATIVE
Ketones, ur: NEGATIVE mg/dL
Leukocytes,Ua: NEGATIVE
Nitrite: NEGATIVE
Protein, ur: NEGATIVE mg/dL
Specific Gravity, Urine: 1.012 (ref 1.005–1.030)
pH: 6 (ref 5.0–8.0)

## 2024-03-14 LAB — BASIC METABOLIC PANEL WITH GFR
Anion gap: 9 (ref 5–15)
BUN: 14 mg/dL (ref 6–20)
CO2: 25 mmol/L (ref 22–32)
Calcium: 8.9 mg/dL (ref 8.9–10.3)
Chloride: 103 mmol/L (ref 98–111)
Creatinine, Ser: 1.13 mg/dL (ref 0.61–1.24)
GFR, Estimated: >60 mL/min (ref >60)
Glucose, Bld: 94 mg/dL (ref 70–99)
Potassium: 4.4 mmol/L (ref 3.5–5.1)
Sodium: 137 mmol/L (ref 135–145)

## 2024-03-14 LAB — CBC WITH DIFFERENTIAL/PLATELET
Abs Immature Granulocytes: 0.01 K/uL (ref 0.00–0.07)
Basophils Absolute: 0 K/uL (ref 0.0–0.1)
Basophils Relative: 0 %
Eosinophils Absolute: 0 K/uL (ref 0.0–0.5)
Eosinophils Relative: 0 %
HCT: 44.2 % (ref 39.0–52.0)
Hemoglobin: 14.4 g/dL (ref 13.0–17.0)
Immature Granulocytes: 0 %
Lymphocytes Relative: 27 %
Lymphs Abs: 1.5 K/uL (ref 0.7–4.0)
MCH: 29.9 pg (ref 26.0–34.0)
MCHC: 32.6 g/dL (ref 30.0–36.0)
MCV: 91.7 fL (ref 80.0–100.0)
Monocytes Absolute: 0.3 K/uL (ref 0.1–1.0)
Monocytes Relative: 6 %
Neutro Abs: 3.7 K/uL (ref 1.7–7.7)
Neutrophils Relative %: 67 %
Platelets: 223 K/uL (ref 150–400)
RBC: 4.82 MIL/uL (ref 4.22–5.81)
RDW: 13.6 % (ref 11.5–15.5)
WBC: 5.6 K/uL (ref 4.0–10.5)
nRBC: 0 % (ref 0.0–0.2)

## 2024-03-14 NOTE — ED Notes (Signed)
 Last call for vitals check and no response.SABRASABRA

## 2024-03-14 NOTE — ED Provider Triage Note (Signed)
 Emergency Medicine Provider Triage Evaluation Note  Todd Randolph , a 43 y.o. male  was evaluated in triage.  Pt complains of multiple episodes of dizziness while at work earlier today.  Denies any LOC or falls.  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  Reports he has not eaten or drink much fluid today.  He did feel better after drinking water.  Reports a couple of other episodes of dizziness earlier this week also associated with not eating or drinking.  Denies any recent illness or fevers.  Denies any recent N/V/D.  Patient is concerned as coworker checked his blood pressure at time of incident earlier and was told his blood pressure was high but cannot recall what the number was.  Denies history of hypertension though it runs in his family.  Has not seen PCP in at least a year.  Does not take any medications and denies any change in medications.  Denies any pain at time of dizziness or any other associated symptoms.  Denies current dizziness.  Review of Systems  Positive: Dizziness Negative: Chest pain, shortness of breath, N/V/D, LOC  Physical Exam  BP 139/85   Pulse 90   Temp 98.4 F (36.9 C)   Resp 18   Ht 6' (1.829 m)   Wt 79.4 kg   SpO2 100%   BMI 23.73 kg/m  Gen:   Awake, no distress  Resp:  Normal effort with clear lung sounds throughout MSK:   Moves extremities without difficulty Other:  Skin is warm and dry.  Medical Decision Making  Medically screening exam initiated at 12:07 PM.  Appropriate orders placed.  Todd Randolph was informed that the remainder of the evaluation will be completed by another provider, this initial triage assessment does not replace that evaluation, and the importance of remaining in the ED until their evaluation is complete.   Rosina Almarie LABOR, PA-C 03/14/24 1210

## 2024-03-14 NOTE — ED Triage Notes (Signed)
 Pt was at work when he felt faint, had his BP checked and it was high so employees encouraged him to come get checked

## 2024-03-14 NOTE — ED Notes (Signed)
Pt called for vitals, no response. 

## 2024-03-17 ENCOUNTER — Ambulatory Visit (HOSPITAL_COMMUNITY)
Admission: EM | Admit: 2024-03-17 | Discharge: 2024-03-17 | Disposition: A | Discharge status: Home / Self Care | Payer: Self-pay | Attending: Emergency Medicine | Admitting: Emergency Medicine

## 2024-03-17 ENCOUNTER — Other Ambulatory Visit: Payer: Self-pay

## 2024-03-17 ENCOUNTER — Encounter (HOSPITAL_COMMUNITY): Payer: Self-pay | Admitting: *Deleted

## 2024-03-17 DIAGNOSIS — K047 Periapical abscess without sinus: Secondary | ICD-10-CM

## 2024-03-17 MED ORDER — AMOXICILLIN-POT CLAVULANATE 875-125 MG PO TABS: 1.0000 | ORAL_TABLET | Freq: Two times a day (BID) | ORAL | 0 refills | Status: DC

## 2024-03-17 MED ORDER — IBUPROFEN 800 MG PO TABS
ORAL_TABLET | ORAL | Status: AC
  Filled 2024-03-17: qty 1

## 2024-03-17 MED ORDER — IBUPROFEN 800 MG PO TABS: 800.0000 mg | ORAL_TABLET | Freq: Three times a day (TID) | ORAL | 0 refills | Status: DC

## 2024-03-17 MED ORDER — IBUPROFEN 800 MG PO TABS
800.0000 mg | ORAL_TABLET | Freq: Once | ORAL | Status: AC
  Administered 2024-03-17: 17:00:00 800 mg via ORAL

## 2024-03-17 NOTE — ED Triage Notes (Signed)
 PT reports his Rt jaw has been swollen for one week.

## 2024-03-17 NOTE — Discharge Instructions (Addendum)
 You have a dental infection.  Take the antibiotics twice daily with food for the next 7 days.  You can take the ibuprofen  every 8 hours to help with pain and swelling.  Do not take this with Motrin , naproxen or other NSAIDs as they are the same class of medicine.  Warm saline gargles, and avoiding chewing on this side.  Follow-up with a dentist in the next week or 2, as you may need some teeth pulled and further dental evaluation.  [facility] in [place], [place]  Address: [address] [place], [place] [address] Phone: [phone]  [facility] [phone] extension [phone] [address]  Dr. Encarnacion [phone] [address]  [facility] [phone] [address] [facility] [place].  [facility] [phone] extension [phone], [place], [place], [address] First come first serve for the first 10 clients.  May do simple extractions only, no wisdom teeth or surgery.  You may try the second for Thursday of the month starting at 6:30 AM.  [facility] You may call the school to see if they are still helping to provide dental care for emergent cases.

## 2024-03-17 NOTE — ED Provider Notes (Signed)
 [facility]    CSN: [id number] Arrival date & time: 03/17/24  1608      History   Chief Complaint Chief Complaint  Patient presents with   Facial Swelling    HPI Savas Elvin is a 43 y.o. male.   Patient presents to clinic over concern of right low jaw dental pain and facial swelling. Has been present for the past week or so. Has multiple eroded molars on this side down to the gumline. Motrin  for pain, not really helping. Has not had fever. No dentist, has not seen one in years. No drainage from the site.   The history is provided by the patient and medical records.    Past Medical History:  Diagnosis Date   Amyloidosis (HCC)    Asthma    Chronic headaches    Heart murmur     There are no active problems to display for this patient.   Past Surgical History:  Procedure Laterality Date   ADENOIDECTOMY     TONSILLECTOMY         Home Medications    Prior to Admission medications  Medication Sig Start Date End Date Taking? Authorizing Provider  amoxicillin -clavulanate (AUGMENTIN ) 875-125 MG tablet Take 1 tablet by mouth every 12 (twelve) hours. 03/17/24  Yes Shandelle Borrelli  N, FNP  ibuprofen  (ADVIL ) 800 MG tablet Take 1 tablet (800 mg total) by mouth 3 (three) times daily. 03/17/24  Yes Rashaun Wichert  N, FNP  famotidine  (PEPCID ) 20 MG tablet Take 1 tablet (20 mg total) by mouth at bedtime. 11/09/23   Raspet, Rocky POUR, PA-C    Family History Family History  Family history unknown: Yes    Social History Social History[1]   Allergies   Patient has no known allergies.   Review of Systems Review of Systems  Per HPI  Physical Exam Triage Vital Signs ED Triage Vitals  Encounter Vitals Group     BP 03/17/24 1656 133/88     Girls Systolic BP Percentile --      Girls Diastolic BP Percentile --      Boys Systolic BP Percentile --      Boys Diastolic BP Percentile --      Pulse Rate 03/17/24 1656 82     Resp 03/17/24 1656 18     Temp  03/17/24 1656 98.5 F (36.9 C)     Temp src --      SpO2 03/17/24 1656 97 %     Weight --      Height --      Head Circumference --      Peak Flow --      Pain Score 03/17/24 1654 10     Pain Loc --      Pain Education --      Exclude from Growth Chart --    No data found.  Updated Vital Signs BP 133/88   Pulse 82   Temp 98.5 F (36.9 C)   Resp 18   SpO2 97%   Visual Acuity Right Eye Distance:   Left Eye Distance:   Bilateral Distance:    Right Eye Near:   Left Eye Near:    Bilateral Near:     Physical Exam Vitals and nursing note reviewed.  Constitutional:      Appearance: Normal appearance.  HENT:     Head: Normocephalic.     Right Ear: External ear normal.     Left Ear: External ear normal.     Nose: Nose  normal.     Mouth/Throat:     Mouth: Mucous membranes are moist.      Comments: Significant molar erosion down to gumline with abscess to cheek side of the gums, w/o drainage  Eyes:     Conjunctiva/sclera: Conjunctivae normal.  Cardiovascular:     Rate and Rhythm: Normal rate.  Pulmonary:     Effort: Pulmonary effort is normal. No respiratory distress.  Musculoskeletal:     Cervical back: Neck supple.  Neurological:     General: No focal deficit present.     Mental Status: He is alert.  Psychiatric:        Behavior: Behavior is cooperative.      UC Treatments / Results  Labs (all labs ordered are listed, but only abnormal results are displayed) Labs Reviewed - No data to display  EKG   Radiology No results found.  Procedures Procedures (including critical care time)  Medications Ordered in UC Medications  ibuprofen  (ADVIL ) tablet 800 mg (800 mg Oral Given 03/17/24 1728)    Initial Impression / Assessment and Plan / UC Course  I have reviewed the triage vital signs and the nursing notes.  Pertinent labs & imaging results that were available during my care of the patient were reviewed by me and considered in my medical decision  making (see chart for details).  Vitals and triage reviewed, patient is hemodynamically stable.  Significant dental erosion of the right lower teeth.  Visible abscess without drainage.  Afebrile without tachycardia, low concern for systemic infection.  Will treat with Augmentin .  Offered IM Toradol , patient declined and opted for oral ibuprofen .  Dental resources provided.  Plan of care, follow-up care and return precautions given, no questions at this time.    Final Clinical Impressions(s) / UC Diagnoses   Final diagnoses:  Dental abscess     Discharge Instructions      You have a dental infection.  Take the antibiotics twice daily with food for the next 7 days.  You can take the ibuprofen  every 8 hours to help with pain and swelling.  Do not take this with Motrin , naproxen or other NSAIDs as they are the same class of medicine.  Warm saline gargles, and avoiding chewing on this side.  Follow-up with a dentist in the next week or 2, as you may need some teeth pulled and further dental evaluation.  [facility] in [place], [place]  Address: [address] [place], [place] [address] Phone: [phone]  [facility] [phone] extension [phone] [address]  Dr. Encarnacion [phone] [address]  [employer] [facility] [phone] [address] [facility] [place].  [facility] [phone] extension [phone], [place], [place], [address] First come first serve for the first 10 clients.  May do simple extractions only, no wisdom teeth or surgery.  You may try the second for Thursday of the month starting at 6:30 AM.  [facility] You may call the school to see if they are still helping to provide dental care for emergent cases.     ED Prescriptions     Medication Sig Dispense Auth. Provider   amoxicillin -clavulanate (AUGMENTIN ) 875-125 MG tablet Take 1 tablet by mouth every 12 (twelve) hours. 14 tablet Dreama,  Liston Thum  N, FNP   ibuprofen  (ADVIL ) 800 MG tablet Take 1 tablet (800 mg total) by mouth 3 (three) times daily. 30 tablet Dreama, Kaori Jumper  N, FNP      PDMP not reviewed this encounter.    [  1]  Social History Tobacco Use   Smoking status: Every Day    Current packs/day: 0.50    Types: Cigarettes   Smokeless tobacco: Never  Vaping Use   Vaping status: Never Used  Substance Use Topics   Alcohol use: No   Drug use: Yes    Types: Marijuana    Comment: once a day     Dreama Shela SAILOR, FNP 03/17/24 1730

## 2024-04-12 ENCOUNTER — Ambulatory Visit (HOSPITAL_COMMUNITY)
Admission: EM | Admit: 2024-04-12 | Discharge: 2024-04-12 | Disposition: A | Discharge status: Home / Self Care | Payer: Self-pay | Attending: Emergency Medicine | Admitting: Emergency Medicine

## 2024-04-12 ENCOUNTER — Encounter (HOSPITAL_COMMUNITY): Payer: Self-pay

## 2024-04-12 DIAGNOSIS — K047 Periapical abscess without sinus: Secondary | ICD-10-CM

## 2024-04-12 MED ORDER — CLINDAMYCIN HCL 300 MG PO CAPS: 300.0000 mg | ORAL_CAPSULE | Freq: Three times a day (TID) | ORAL | 0 refills | Status: AC

## 2024-04-12 MED ORDER — IBUPROFEN 800 MG PO TABS: 800.0000 mg | ORAL_TABLET | Freq: Three times a day (TID) | ORAL | 0 refills | Status: AC

## 2024-04-12 NOTE — ED Triage Notes (Signed)
 Pt presents with dental pain and pressure x 3 days.

## 2024-04-12 NOTE — Discharge Instructions (Signed)
 Start taking clindamycin  3 times daily for 7 days for dental abscess coverage. You can take 800 mg ibuprofen  every 8 hours as needed for pain.  You can take 500 to 1000 mg of Tylenol  every 6-8 hours as needed for breakthrough pain. I have attached a list of low-cost local dental resources that you can follow-up with for further evaluation and management of this.

## 2024-04-12 NOTE — ED Provider Notes (Signed)
 " [facility]    CSN: [id number] Arrival date & time: 04/12/24  9070      History   Chief Complaint Chief Complaint  Patient presents with   Dental Pain    HPI Todd Randolph is a 44 y.o. male.   Patient presents with right lower dental pain and swelling that began 3 days ago.  Patient was seen in December for same and states that the area seemed to never completely heal as he continued to have pain, but then began having swelling again 3 days ago.  Patient denies any fever.  Patient reports that he has not seen a dentist for this issue yet.  The history is provided by the patient and medical records.  Dental Pain   Past Medical History:  Diagnosis Date   Amyloidosis (HCC)    Asthma    Chronic headaches    Heart murmur     There are no active problems to display for this patient.   Past Surgical History:  Procedure Laterality Date   ADENOIDECTOMY     TONSILLECTOMY         Home Medications    Prior to Admission medications  Medication Sig Start Date End Date Taking? Authorizing Provider  clindamycin  (CLEOCIN ) 300 MG capsule Take 1 capsule (300 mg total) by mouth 3 (three) times daily for 7 days. 04/12/24 04/19/24 Yes Colbi Staubs A, NP  ibuprofen  (ADVIL ) 800 MG tablet Take 1 tablet (800 mg total) by mouth 3 (three) times daily. 04/12/24  Yes Johnie Rumaldo LABOR, NP    Family History Family History  Family history unknown: Yes    Social History Social History[1]   Allergies   Patient has no known allergies.   Review of Systems Review of Systems  Per HPI  Physical Exam Triage Vital Signs ED Triage Vitals [04/12/24 0959]  Encounter Vitals Group     BP 132/83     Girls Systolic BP Percentile      Girls Diastolic BP Percentile      Boys Systolic BP Percentile      Boys Diastolic BP Percentile      Pulse Rate 66     Resp 18     Temp 99.8 F (37.7 C)     Temp Source Oral     SpO2 98 %     Weight      Height      Head  Circumference      Peak Flow      Pain Score      Pain Loc      Pain Education      Exclude from Growth Chart    No data found.  Updated Vital Signs BP 132/83 (BP Location: Left Arm)   Pulse 66   Temp 99.8 F (37.7 C) (Oral)   Resp 18   SpO2 98%   Visual Acuity Right Eye Distance:   Left Eye Distance:   Bilateral Distance:    Right Eye Near:   Left Eye Near:    Bilateral Near:     Physical Exam Vitals and nursing note reviewed.  Constitutional:      General: He is awake. He is not in acute distress.    Appearance: Normal appearance. He is well-developed and well-groomed. He is not ill-appearing.  HENT:     Head:     Jaw: Swelling present.     Comments: Mild swelling noted to right lower jaw    Mouth/Throat:  Dentition: Abnormal dentition. Dental tenderness, gingival swelling and dental abscesses present.      Comments: Significant molar erosion to gumline with abscess to outer gumline without any active drainage at this time. Skin:    General: Skin is warm and dry.  Neurological:     General: No focal deficit present.     Mental Status: He is alert and oriented to person, place, and time. Mental status is at baseline.  Psychiatric:        Behavior: Behavior is cooperative.      UC Treatments / Results  Labs (all labs ordered are listed, but only abnormal results are displayed) Labs Reviewed - No data to display  EKG   Radiology No results found.  Procedures Procedures (including critical care time)  Medications Ordered in UC Medications - No data to display  Initial Impression / Assessment and Plan / UC Course  I have reviewed the triage vital signs and the nursing notes.  Pertinent labs & imaging results that were available during my care of the patient were reviewed by me and considered in my medical decision making (see chart for details).     Patient is overall well-appearing.  Vitals are stable.  Prescribe clindamycin  for dental  abscess coverage.  Prescribed ibuprofen  as needed for pain.  Recommended alternating with Tylenol .  Given dental resources.  Discussed follow-up and return precautions. Final Clinical Impressions(s) / UC Diagnoses   Final diagnoses:  Dental abscess     Discharge Instructions      Start taking clindamycin  3 times daily for 7 days for dental abscess coverage. You can take 800 mg ibuprofen  every 8 hours as needed for pain.  You can take 500 to 1000 mg of Tylenol  every 6-8 hours as needed for breakthrough pain. I have attached a list of low-cost local dental resources that you can follow-up with for further evaluation and management of this.   ED Prescriptions     Medication Sig Dispense Auth. Provider   ibuprofen  (ADVIL ) 800 MG tablet Take 1 tablet (800 mg total) by mouth 3 (three) times daily. 21 tablet Johnie Flaming A, NP   clindamycin  (CLEOCIN ) 300 MG capsule Take 1 capsule (300 mg total) by mouth 3 (three) times daily for 7 days. 21 capsule Johnie Flaming A, NP      PDMP not reviewed this encounter.    [1]  Social History Tobacco Use   Smoking status: Every Day    Current packs/day: 0.50    Types: Cigarettes   Smokeless tobacco: Never  Vaping Use   Vaping status: Never Used  Substance Use Topics   Alcohol use: No   Drug use: Yes    Types: Marijuana    Comment: once a day     Johnie Flaming A, NP 04/12/24 1025  "
